# Patient Record
Sex: Female | Born: 1985 | Race: White | Hispanic: No | Marital: Single | Smoking: Never smoker
Health system: Southern US, Community
[De-identification: ages and names within clinical notes are randomized; demographics above are authoritative.]

## PROBLEM LIST (undated history)

## (undated) DIAGNOSIS — F329 Major depressive disorder, single episode, unspecified: Secondary | ICD-10-CM

## (undated) DIAGNOSIS — K5120 Ulcerative (chronic) proctitis without complications: Secondary | ICD-10-CM

## (undated) DIAGNOSIS — F419 Anxiety disorder, unspecified: Secondary | ICD-10-CM

## (undated) DIAGNOSIS — D649 Anemia, unspecified: Secondary | ICD-10-CM

## (undated) DIAGNOSIS — F32A Depression, unspecified: Secondary | ICD-10-CM

## (undated) HISTORY — DX: Ulcerative (chronic) proctitis without complications: K51.20

## (undated) HISTORY — DX: Depression, unspecified: F32.A

## (undated) HISTORY — PX: TONSILLECTOMY: SUR1361

## (undated) HISTORY — DX: Anxiety disorder, unspecified: F41.9

## (undated) HISTORY — DX: Major depressive disorder, single episode, unspecified: F32.9

## (undated) HISTORY — DX: Anemia, unspecified: D64.9

---
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
## 2005-09-11 ENCOUNTER — Other Ambulatory Visit: Admission: RE | Admit: 2005-09-11 | Discharge: 2005-09-11 | Payer: Self-pay | Admitting: Gynecology
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
## 2006-11-18 ENCOUNTER — Other Ambulatory Visit: Admission: RE | Admit: 2006-11-18 | Discharge: 2006-11-18 | Payer: Self-pay | Admitting: Gynecology

## 2007-06-17 ENCOUNTER — Encounter: Payer: Self-pay | Admitting: Internal Medicine

## 2007-06-19 DIAGNOSIS — R198 Other specified symptoms and signs involving the digestive system and abdomen: Secondary | ICD-10-CM | POA: Insufficient documentation

## 2007-06-19 DIAGNOSIS — R197 Diarrhea, unspecified: Secondary | ICD-10-CM | POA: Insufficient documentation

## 2007-06-19 DIAGNOSIS — K921 Melena: Secondary | ICD-10-CM | POA: Insufficient documentation

## 2007-06-19 DIAGNOSIS — R1032 Left lower quadrant pain: Secondary | ICD-10-CM | POA: Insufficient documentation

## 2007-12-17 ENCOUNTER — Other Ambulatory Visit: Admission: RE | Admit: 2007-12-17 | Discharge: 2007-12-17 | Payer: Self-pay | Admitting: Gynecology

## 2008-02-26 ENCOUNTER — Ambulatory Visit: Payer: Self-pay | Admitting: Internal Medicine

## 2008-02-27 ENCOUNTER — Telehealth: Payer: Self-pay | Admitting: Internal Medicine

## 2008-03-02 ENCOUNTER — Encounter: Payer: Self-pay | Admitting: Internal Medicine

## 2008-03-02 ENCOUNTER — Ambulatory Visit: Payer: Self-pay | Admitting: Internal Medicine

## 2008-03-03 ENCOUNTER — Encounter: Payer: Self-pay | Admitting: Internal Medicine

## 2008-03-03 ENCOUNTER — Encounter (INDEPENDENT_AMBULATORY_CARE_PROVIDER_SITE_OTHER): Payer: Self-pay | Admitting: *Deleted

## 2008-03-03 ENCOUNTER — Telehealth: Payer: Self-pay | Admitting: Internal Medicine

## 2008-04-05 ENCOUNTER — Ambulatory Visit: Payer: Self-pay | Admitting: Internal Medicine

## 2008-06-21 ENCOUNTER — Emergency Department (HOSPITAL_COMMUNITY): Admission: EM | Admit: 2008-06-21 | Discharge: 2008-06-22 | Payer: Self-pay | Admitting: Emergency Medicine

## 2008-09-03 ENCOUNTER — Emergency Department (HOSPITAL_COMMUNITY): Admission: EM | Admit: 2008-09-03 | Discharge: 2008-09-03 | Payer: Self-pay | Admitting: Emergency Medicine

## 2010-05-07 LAB — RAPID URINE DRUG SCREEN, HOSP PERFORMED
Amphetamines: NOT DETECTED
Barbiturates: NOT DETECTED
Benzodiazepines: NOT DETECTED
Cocaine: NOT DETECTED
Opiates: NOT DETECTED
Tetrahydrocannabinol: NOT DETECTED

## 2010-05-07 LAB — POCT PREGNANCY, URINE: Preg Test, Ur: NEGATIVE

## 2010-05-09 LAB — URINE MICROSCOPIC-ADD ON

## 2010-05-09 LAB — URINALYSIS, ROUTINE W REFLEX MICROSCOPIC
Bilirubin Urine: NEGATIVE
Glucose, UA: NEGATIVE mg/dL
Hgb urine dipstick: NEGATIVE
Leukocytes, UA: NEGATIVE
Nitrite: NEGATIVE
Protein, ur: 30 mg/dL — AB
Specific Gravity, Urine: 1.035 — ABNORMAL HIGH (ref 1.005–1.030)
Urobilinogen, UA: 1 mg/dL (ref 0.0–1.0)
pH: 7 (ref 5.0–8.0)

## 2010-05-09 LAB — WET PREP, GENITAL: Yeast Wet Prep HPF POC: NONE SEEN

## 2010-05-09 LAB — POCT PREGNANCY, URINE: Preg Test, Ur: NEGATIVE

## 2012-08-21 ENCOUNTER — Telehealth: Payer: Self-pay | Admitting: Internal Medicine

## 2012-08-21 NOTE — Telephone Encounter (Signed)
Spoke with patient and she states she had moved th [place] but has moved back to [place]. She has not had any problems since seeing Dr. Juanda Chance in 2010. She is at [facility] on vacation. She states she was bitten by a spider recently and went to Urgent Care. She was given a "shot of Prednisone" for the spider bite. She had some diarrhea then also and was told by UC that it was viral. She continued to have diarrhea and went to ED and was given Prednisone. Continues to have diarrhea going on 3 weeks. Scheduled her for OV with Willette Cluster, NP on 08/22/12 at 2:00 PM.

## 2012-08-21 NOTE — Telephone Encounter (Signed)
Left a message for patient to call me. 

## 2012-08-22 ENCOUNTER — Other Ambulatory Visit: Payer: Managed Care, Other (non HMO)

## 2012-08-22 ENCOUNTER — Ambulatory Visit (INDEPENDENT_AMBULATORY_CARE_PROVIDER_SITE_OTHER): Payer: Managed Care, Other (non HMO) | Admitting: Nurse Practitioner

## 2012-08-22 VITALS — BP 96/70 | HR 88 | Ht 63.5 in | Wt 121.5 lb

## 2012-08-22 DIAGNOSIS — R197 Diarrhea, unspecified: Secondary | ICD-10-CM

## 2012-08-22 DIAGNOSIS — K5190 Ulcerative colitis, unspecified, without complications: Secondary | ICD-10-CM

## 2012-08-22 NOTE — Progress Notes (Signed)
Reviewed, I would not wait for C.Diff to come back,  Would provide samples o fLialda or Delzicol  for 2 week period till we will obtain labs and/or till she can see me.

## 2012-08-22 NOTE — Patient Instructions (Addendum)
Your physician has requested that you go to the basement for the following lab work before leaving today: C DIff Gunnar Fusi will call you after the results are received. Follow up with Dr Juanda Chance on 09/23/2012 3:45 pm.  CC:  Loyal Jacobson MD

## 2012-08-22 NOTE — Progress Notes (Signed)
HPI :  Patient is a 65 year diagnosed with UC by Dr. Juanda Chance in 2010. 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 Patient was started on prednisone and Asacol. She moved to [place] approximatley 6 months later and we have not seen her since. Patient's symptoms improved but she discontinued Asacol as the pills were too large to swallow. Patient actually did fine for years from a GI standpoint until two weeks when she developed diarrhea after beng bitten by a spider.. Patient went to Urgent Care for the diarrhea, was given a steroid injection. Diarrhea improved for one day but patient ended up in ED in [facility] last Monday with loose black stools . She has not been taking iron nor bismuth products. I do not have the records but patient was told her lab work was okay. She was given 9 days of prednisone to take . Again, she felt better after the first day prednisone but then loose stools recurred. Stool color now normal. No rectal bleeding . She has lower abdominal pain with defecation and complains of LLQ "tightness" for a few months. He should not convinced this is ulcerative colitis. She is worried about C. difficile colitis as family members have a history of this. Patient did have antibiotics but that's been several months back. She has not started any new medications other than prednisone. No travel out of the country.  Past Medical History  Diagnosis Date  . Ulcerative colitis   . Anemia   . Anxiety   . Depression    Family History  Problem Relation Age of Onset  . Breast cancer Paternal Grandmother    History  Substance Use Topics  . Smoking status: Never Smoker   . Smokeless tobacco: Never Used  . Alcohol Use: Yes     Comment: 1 glass o fwine every 6 months   Current Outpatient Prescriptions  Medication Sig Dispense Refill  . citalopram (CELEXA) 40 MG tablet Take 40 mg by mouth daily.      Marland Kitchen MONONESSA 0.25-35 MG-MCG tablet Take 1 tablet by mouth daily.       .  predniSONE (DELTASONE) 20 MG tablet as directed. Tapering dose       No current facility-administered medications for this visit.   Allergies  Allergen Reactions  . Cefdinir Diarrhea    Abdominal pain   Review of Systems: All systems reviewed and negative except where noted in HPI.   Physical Exam: BP 96/70  Pulse 88  Ht 5' 3.5" (1.613 m)  Wt 121 lb 8 oz (55.112 kg)  BMI 21.18 kg/m2  LMP 07/28/2012 Constitutional: Pleasant,well-developed, white female in no acute distress. HEENT: Normocephalic and atraumatic. Conjunctivae are normal. No scleral icterus. Neck supple.  Cardiovascular: Normal rate, regular rhythm.  Pulmonary/chest: Effort normal and breath sounds normal. No wheezing, rales or rhonchi. Abdominal: Soft, nondistended, nontender. Bowel sounds active throughout. There are no masses palpable. No hepatomegaly. Rectal: No external hemorrhoids .On internal hemorrhoids on anoscopy but mucosa erythematous. Light brown, heme negative stool Extremities: no edema Lymphadenopathy: No cervical adenopathy noted. Neurological: Alert and oriented to person place and time. Skin: Skin is warm and dry. No rashes noted. Psychiatric: Normal mood and affect. Behavior is normal.   ASSESSMENT AND PLAN: 27 year old female diagnosed by Dr. Juanda Chance with universal UC in 2010. She has been off treatment for 5 years and doing okay until 2 weeks ago. Now with non-bloody diarrhea. Patient given a very short prednsone taper by ED in [place]  on Monday and so far hasn't improved. Will check stool for C-diff and call her with results. If C-diff negative then will restart Mesalamine (a brand other than Asacol as pills too large) and probably give her a longer prednisone taper. She will need follow up with Dr. Juanda Chance, her primary gastroenterologist..

## 2012-08-25 ENCOUNTER — Telehealth: Payer: Self-pay | Admitting: Nurse Practitioner

## 2012-08-25 LAB — CLOSTRIDIUM DIFFICILE BY PCR: Toxigenic C. Difficile by PCR: NOT DETECTED

## 2012-08-25 NOTE — Telephone Encounter (Signed)
Patient advised that her c-diff was negative would resume mesalamine and prednisone.  Willette Cluster RNP please advise

## 2012-08-27 ENCOUNTER — Other Ambulatory Visit: Payer: Self-pay | Admitting: Nurse Practitioner

## 2012-08-27 MED ORDER — MESALAMINE 1.2 G PO TBEC: 4800.0000 mg | DELAYED_RELEASE_TABLET | Freq: Every day | ORAL | Status: DC

## 2012-09-03 ENCOUNTER — Telehealth: Payer: Self-pay | Admitting: Nurse Practitioner

## 2012-09-03 NOTE — Telephone Encounter (Signed)
Left a message for patient to call me. 

## 2012-09-04 NOTE — Telephone Encounter (Signed)
Spoke with patient and she states she started having severe headaches just a few days after starting Lialda. She stopped taking it and the headaches went away. She does not get headaches routinely. She is asking for something else to try. Patient asks we leave her a voice mail with any changes. Please, advise.

## 2012-09-04 NOTE — Telephone Encounter (Signed)
Left a message for patient to call me. 

## 2012-09-05 NOTE — Telephone Encounter (Signed)
Spoke with Willette Cluster, NP and if patient's OV with Dr. Juanda Chance is in the near future, may stay off medication until then.  If she is not doing well, may need to switch medications. Moved OV to 09/16/12 at 10:30 am with Dr. Juanda Chance. Left message for patient with new appointment and request she call me to confirm it.

## 2012-09-08 NOTE — Telephone Encounter (Signed)
Left a message for patient to call me. 

## 2012-09-08 NOTE — Telephone Encounter (Signed)
Spoke with patient and she wants to keep her appointment on 09/23/12 at this time. Rescheduled to 10:00 AM on that day. She will call back if she can move the appointment.

## 2012-09-09 ENCOUNTER — Encounter: Payer: Self-pay | Admitting: Internal Medicine

## 2012-09-09 NOTE — Telephone Encounter (Signed)
Error

## 2012-09-16 ENCOUNTER — Ambulatory Visit: Payer: Managed Care, Other (non HMO) | Admitting: Internal Medicine

## 2012-09-22 ENCOUNTER — Telehealth: Payer: Self-pay | Admitting: Internal Medicine

## 2012-09-22 NOTE — Telephone Encounter (Signed)
Left a message for patient to call me. 

## 2012-09-23 ENCOUNTER — Ambulatory Visit: Payer: Managed Care, Other (non HMO) | Admitting: Internal Medicine

## 2012-09-23 NOTE — Telephone Encounter (Signed)
Left a message for patient to call me. 

## 2012-09-24 NOTE — Telephone Encounter (Signed)
Spoke with patient and she will have her schedule tomorrow for the next month. She will call back and ask for me and will schedule f/u OV.

## 2012-09-26 ENCOUNTER — Telehealth: Payer: Self-pay | Admitting: Nurse Practitioner

## 2012-10-01 NOTE — Telephone Encounter (Signed)
Erroneous encounter

## 2013-02-04 ENCOUNTER — Encounter: Payer: Self-pay | Admitting: Internal Medicine

## 2013-03-18 ENCOUNTER — Telehealth: Payer: Self-pay | Admitting: Internal Medicine

## 2013-03-18 NOTE — Telephone Encounter (Signed)
Patient wants to schedule OV for UC f/u and to discuss colonoscopy. Offered OV on 2/24 or 03/27/13 but she cannot do either of these. She wants to schedule on 04/16/13 at 8:45 AM. Scheduled patient on that day.

## 2013-03-20 ENCOUNTER — Encounter: Payer: Self-pay | Admitting: *Deleted

## 2013-04-03 ENCOUNTER — Other Ambulatory Visit (INDEPENDENT_AMBULATORY_CARE_PROVIDER_SITE_OTHER): Payer: Managed Care, Other (non HMO)

## 2013-04-03 ENCOUNTER — Ambulatory Visit (INDEPENDENT_AMBULATORY_CARE_PROVIDER_SITE_OTHER): Payer: Managed Care, Other (non HMO) | Admitting: Internal Medicine

## 2013-04-03 ENCOUNTER — Encounter: Payer: Self-pay | Admitting: Internal Medicine

## 2013-04-03 VITALS — BP 102/70 | HR 80 | Ht 63.5 in | Wt 135.5 lb

## 2013-04-03 DIAGNOSIS — K5190 Ulcerative colitis, unspecified, without complications: Secondary | ICD-10-CM

## 2013-04-03 DIAGNOSIS — K921 Melena: Secondary | ICD-10-CM

## 2013-04-03 LAB — CBC WITH DIFFERENTIAL/PLATELET
BASOS ABS: 0.1 10*3/uL (ref 0.0–0.1)
Basophils Relative: 0.6 % (ref 0.0–3.0)
EOS ABS: 0.2 10*3/uL (ref 0.0–0.7)
Eosinophils Relative: 2.4 % (ref 0.0–5.0)
HCT: 40 % (ref 36.0–46.0)
Hemoglobin: 13.3 g/dL (ref 12.0–15.0)
LYMPHS PCT: 41.1 % (ref 12.0–46.0)
Lymphs Abs: 3.6 10*3/uL (ref 0.7–4.0)
MCHC: 33.3 g/dL (ref 30.0–36.0)
MCV: 84.9 fl (ref 78.0–100.0)
Monocytes Absolute: 0.6 10*3/uL (ref 0.1–1.0)
Monocytes Relative: 7.3 % (ref 3.0–12.0)
NEUTROS PCT: 48.6 % (ref 43.0–77.0)
Neutro Abs: 4.2 10*3/uL (ref 1.4–7.7)
PLATELETS: 293 10*3/uL (ref 150.0–400.0)
RBC: 4.72 Mil/uL (ref 3.87–5.11)
RDW: 12.9 % (ref 11.5–14.6)
WBC: 8.7 10*3/uL (ref 4.5–10.5)

## 2013-04-03 LAB — SEDIMENTATION RATE: Sed Rate: 14 mm/hr (ref 0–22)

## 2013-04-03 LAB — IBC PANEL
Iron: 83 ug/dL (ref 42–145)
SATURATION RATIOS: 14.5 % — AB (ref 20.0–50.0)
TRANSFERRIN: 410 mg/dL — AB (ref 212.0–360.0)

## 2013-04-03 MED ORDER — MOVIPREP 100 G PO SOLR: 1.0000 | Freq: Once | ORAL | Status: DC

## 2013-04-03 MED ORDER — MESALAMINE 1.2 G PO TBEC: 2.4000 g | DELAYED_RELEASE_TABLET | Freq: Two times a day (BID) | ORAL | Status: DC

## 2013-04-03 MED ORDER — PREDNISONE 20 MG PO TABS: ORAL_TABLET | ORAL | Status: DC

## 2013-04-03 NOTE — Progress Notes (Signed)
Kayla Davidson 03/18/1985 [id number]  Note: This dictation was prepared with Dragon digital system. Any transcriptional errors that result from this procedure are unintentional.   History of Present Illness:  This is a 28 year old white female with a history of ulcerative pan colitis now with exacerbation. She describes mucus and blood per rectum. She has normal formed stools. She does have crampy abdominal pain. She denies urgency. Her energy has been very low. We saw her in 2010 for diarrhea and bleeding. A colonoscopy showed universal ulcerative colitis. Patient to [place] after I saw her in 2010  and moved back to [place] 2 years ago. She saw Midge MiniumPaula Guenther,NP in July 2014 for crampy abdominal pain. Her blood count was 13.8 and her hematocrit was 40.1. Her C Diff toxin was negative. She has not been taking any medications. There is no family history of colon cancer or colitis. 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    Past Medical History  Diagnosis Date  . Ulcerative colitis   . Anemia   . Anxiety   . Depression     Past Surgical History  Procedure Laterality Date  . Tonsillectomy      Allergies  Allergen Reactions  . Cefdinir Diarrhea    Abdominal pain     Family history and social history have been reviewed.  Review of Systems: Denies nausea vomiting heartburn  The remainder of the 10 point ROS is negative except as outlined in the H&P  Physical Exam: General Appearance Well developed, in no distress Eyes  Non icteric  HEENT  Non traumatic, normocephalic  Mouth No lesion, tongue papillated, no cheilosis Neck Supple without adenopathy, thyroid not enlarged, no carotid bruits, no JVD Lungs Clear to auscultation bilaterally COR Normal S1, normal S2, regular rhythm, no murmur, quiet precordium Abdomen minimally tender in left lower quadrant. Normoactive bowel sounds. No tympany. Liver edge at costal margin Rectal and anoscopic exam reveals  friable bleeding mucosa with mucous and blood consistent with moderately severe colitis. There are no hemorrhoids. Rectal sphincter tone is normal Extremities  No pedal edema Skin No lesions Neurological Alert and oriented x 3 Psychological Normal mood and affect  Assessment and Plan:   Problem #1 Active ulcerative colitis involving the rectum and possibility the left colon. Patient is not having any diarrhea. There is however, a history of universal ulcerative colitis on her last colonoscopy in 2010. We will schedule her for a colonoscopy and biopsies. In the meantime, she will start on mesalamine 4.8 g daily and on prednisone 40 mg daily for 3 days then down to 30 mg daily until her colonoscopy next week. We will also have her to get a blood count, sed rate and iron studies checked. I have given her samples of Canasa suppositories 1,000 mg to use at night. We have discussed maintenance therapy and diet. At this time, she does not have to be on a low-residue diet because her stools are formed and regular.    Lina SarDora Suleyman Ehrman 04/03/2013

## 2013-04-03 NOTE — Patient Instructions (Signed)
You have been scheduled for a colonoscopy with propofol. Please follow written instructions given to you at your visit today.  Please pick up your prep kit at the pharmacy within the next 1-3 days. If you use inhalers (even only as needed), please bring them with you on the day of your procedure. Your physician has requested that you go to www.startemmi.com and enter the access code given to you at your visit today. This web site gives a general overview about your procedure. However, you should still follow specific instructions given to you by our office regarding your preparation for the procedure.  Your physician has requested that you go to the basement for the following lab work before leaving today: CBC, IBC, Sed Rate  We have sent the following medications to your pharmacy for you to pick up at your convenience: Lialda Prednisone  Dr Olevia Perches has advised that you be on a prednisone taper. The taper instructions are as follows: 40 mg x 3 days 30 mg x 2 weeks 20 mg thereafter until told otherwise  CC:Dr Jefm Petty

## 2013-04-06 ENCOUNTER — Telehealth: Payer: Self-pay | Admitting: *Deleted

## 2013-04-06 NOTE — Telephone Encounter (Signed)
Patient states the day she started the Lialda she started having urgent watery diarrhea 5-8/day. Bleeding has stopped. Patient thinks the Theodosia BlenderLialda is causing the diarrhea since it started when she started taking it. Please, advise.

## 2013-04-06 NOTE — Telephone Encounter (Signed)
Please stop Lialda and continue Prednisone.

## 2013-04-06 NOTE — Telephone Encounter (Signed)
Left a message for patient to call me. 

## 2013-04-06 NOTE — Telephone Encounter (Signed)
Patient given Dr. Brodie's recommendations. 

## 2013-04-09 ENCOUNTER — Ambulatory Visit (AMBULATORY_SURGERY_CENTER): Payer: Managed Care, Other (non HMO) | Admitting: Internal Medicine

## 2013-04-09 ENCOUNTER — Encounter: Payer: Self-pay | Admitting: Internal Medicine

## 2013-04-09 ENCOUNTER — Encounter: Payer: Self-pay | Admitting: *Deleted

## 2013-04-09 VITALS — BP 113/74 | HR 64 | Temp 97.9°F | Resp 25 | Ht 63.5 in | Wt 135.0 lb

## 2013-04-09 DIAGNOSIS — K921 Melena: Secondary | ICD-10-CM

## 2013-04-09 DIAGNOSIS — K5190 Ulcerative colitis, unspecified, without complications: Secondary | ICD-10-CM

## 2013-04-09 DIAGNOSIS — K6289 Other specified diseases of anus and rectum: Secondary | ICD-10-CM

## 2013-04-09 MED ORDER — HYDROCORTISONE ACETATE 25 MG RE SUPP: 25.0000 mg | Freq: Two times a day (BID) | RECTAL | Status: DC

## 2013-04-09 MED ORDER — SODIUM CHLORIDE 0.9 % IV SOLN: 500.0000 mL | INTRAVENOUS | Status: DC

## 2013-04-09 NOTE — Progress Notes (Signed)
Called to room to assist during endoscopic procedure.  Patient ID and intended procedure confirmed with present staff. Received instructions for my participation in the procedure from the performing physician.  

## 2013-04-09 NOTE — Progress Notes (Signed)
A/ox3 pleased with MAC, report to celia RN 

## 2013-04-09 NOTE — Patient Instructions (Signed)
Discharge instructions given with verbal understanding. Samples given per Dr. Juanda ChanceBrodie Prescription given per Dr. Juanda ChanceBrodie. Resume previous medications. YOU HAD AN ENDOSCOPIC PROCEDURE TODAY AT THE [facility]: Refer to the procedure report that was given to you for any specific questions about what was found during the examination.  If the procedure report does not answer your questions, please call your gastroenterologist to clarify.  If you requested that your care partner not be given the details of your procedure findings, then the procedure report has been included in a sealed envelope for you to review at your convenience later.  YOU SHOULD EXPECT: Some feelings of bloating in the abdomen. Passage of more gas than usual.  Walking can help get rid of the air that was put into your GI tract during the procedure and reduce the bloating. If you had a lower endoscopy (such as a colonoscopy or flexible sigmoidoscopy) you may notice spotting of blood in your stool or on the toilet paper. If you underwent a bowel prep for your procedure, then you may not have a normal bowel movement for a few days.  DIET: Your first meal following the procedure should be a light meal and then it is ok to progress to your normal diet.  A half-sandwich or bowl of soup is an example of a good first meal.  Heavy or fried foods are harder to digest and may make you feel nauseous or bloated.  Likewise meals heavy in dairy and vegetables can cause extra gas to form and this can also increase the bloating.  Drink plenty of fluids but you should avoid alcoholic beverages for 24 hours.  ACTIVITY: Your care partner should take you home directly after the procedure.  You should plan to take it easy, moving slowly for the rest of the day.  You can resume normal activity the day after the procedure however you should NOT DRIVE or use heavy machinery for 24 hours (because of the sedation medicines used during the test).     SYMPTOMS TO REPORT IMMEDIATELY: A gastroenterologist can be reached at any hour.  During normal business hours, 8:30 AM to 5:00 PM Monday through Friday, call [phone].  After hours and on weekends, please call the GI answering service at [phone] who will take a message and have the physician on call contact you.   Following lower endoscopy (colonoscopy or flexible sigmoidoscopy):  Excessive amounts of blood in the stool  Significant tenderness or worsening of abdominal pains  Swelling of the abdomen that is new, acute  Fever of 100F or higher  FOLLOW UP: If any biopsies were taken you will be contacted by phone or by letter within the next 1-3 weeks.  Call your gastroenterologist if you have not heard about the biopsies in 3 weeks.  Our staff will call the home number listed on your records the next business day following your procedure to check on you and address any questions or concerns that you may have at that time regarding the information given to you following your procedure. This is a courtesy call and so if there is no answer at the home number and we have not heard from you through the emergency physician on call, we will assume that you have returned to your regular daily activities without incident.  SIGNATURES/CONFIDENTIALITY: You and/or your care partner have signed paperwork which will be entered into your electronic medical record.  These signatures attest to the fact that that the information above  on your After Visit Summary has been reviewed and is understood.  Full responsibility of the confidentiality of this discharge information lies with you and/or your care-partner.

## 2013-04-09 NOTE — Op Note (Addendum)
[facility] [address] [place], [address]   COLONOSCOPY PROCEDURE REPORT  PATIENT: Kayla, Davidson  MR#: [id number] BIRTHDATE: 15-May-1985 , 28  yrs. old GENDER: Female ENDOSCOPIST: Hart Carwin, MD REFERRED [id number] Leonette Most, M.D. PROCEDURE DATE:  04/09/2013 PROCEDURE:   Colonoscopy with biopsy First Screening Colonoscopy - Avg.  risk and is 50 yrs.  old or older - No.  Prior Negative Screening - Now for repeat screening. N/A  History of Adenoma - Now for follow-up colonoscopy & has been > or = to 3 yrs.  N/A  Polyps Removed Today? No.  Recommend repeat exam, <10 yrs? Yes.  High risk (family or personal hx). ASA CLASS:   Class II INDICATIONS:ER.  History of ulcerative colitis.  Last colonoscopy in 2000 and showed pancolitis.Marland Kitchen MEDICATIONS: MAC sedation, administered by CRNA and propofol (Diprivan) 350mg  IV  DESCRIPTION OF PROCEDURE:   After the risks benefits and alternatives of the procedure were thoroughly explained, informed consent was obtained.  A digital rectal exam revealed no abnormalities of the rectum.   The LB [id number]  endoscope was introduced through the anus and advanced to the cecum, which was identified by both the appendix and ileocecal valve. No adverse events experienced.   The quality of the prep was good, using MoviPrep  The instrument was then slowly withdrawn as the colon was fully examined.      COLON FINDINGS: Rectal ampulla and rectal sigmoid colon appeared inflamed.  There was granularity bleeding and friable mucosa from 0-5 cm these findings were consistent with the ulcerative proctitis.  Starting at the 10 cm from the rectum mucosa appeared the erythematous but there was no friability or granularity. Biopsies were taken from sigmoid colon from 10-30 cm.  Descending colon was normal, splenic flexure transverse colon hepatic flexure and ascending colon appeared normal cecum 5 showed erythema and around the  appendiceal opening.  Biopsies were obtained from cecal pouch.  The ileocecal valve was normal and the scope was then retracted..  Retroflexion of the endoscope in the rectum confirmed presence of proctitis.  Retroflexed views revealed no abnormalities. The time to cecum=7 minutes 32 seconds.  Withdrawal time=9 minutes 58 seconds.  The scope was withdrawn and the procedure completed. COMPLICATIONS: There were no complications.  ENDOSCOPIC IMPRESSION: Rectal ampulla and rectal sigmoid colon appeared inflamed.  There was granularity bleeding and friable mucosa from 0-5 cm these findings were consistent with the ulcerative proctitis.  Starting at the 10 cm from the rectum mucosa appeared the erythematous but there was no friability or granularity.  Biopsies were taken from sigmoid colon from 10-30 cm.  Descending colon was normal, splenic flexure transverse colon hepatic flexure and ascending colon appeared normal cecum 5 showed erythema and around the appendiceal opening.  Biopsies were obtained from cecal pouch.  The ileocecal valve was normal and the scope was then retracted..  Retroflexion of the endoscope in the rectum confirmed presence of proctitis Conclusion: active ulcerative proctitis area Mild colitis in the cecum Normal appearing descending transverse and descending colon biopsies pending   RECOMMENDATIONS: 1.  Await pathology results 2.  Canasa suppositories 1000mg , samples, 1 hs, if insurance does not cover, start Hydrocorticone supp 25 mg, bid, #12, 3 refills Continue prednisone taper Lialda stopped due to side effects FMLA form to be filled out OV 4 weeks Recall colonoscopy pending biopsies   eSigned:  Hart Carwin, MD 04/09/2013 11:24 AM Revised: 04/09/2013 11:24 AM  cc:   PATIENT NAME:  Kayla Davidson, Kayla A. MR#: [id number]

## 2013-04-10 ENCOUNTER — Telehealth: Payer: Self-pay | Admitting: *Deleted

## 2013-04-10 NOTE — Telephone Encounter (Signed)
No answer. Name identifier. Message left to call if any questions or concerns. 

## 2013-04-14 ENCOUNTER — Encounter: Payer: Self-pay | Admitting: Internal Medicine

## 2013-04-16 ENCOUNTER — Ambulatory Visit: Payer: Managed Care, Other (non HMO) | Admitting: Internal Medicine

## 2013-04-17 ENCOUNTER — Encounter: Payer: Self-pay | Admitting: *Deleted

## 2013-04-17 ENCOUNTER — Telehealth: Payer: Self-pay | Admitting: Internal Medicine

## 2013-04-17 MED ORDER — LORAZEPAM 0.5 MG PO TABS: ORAL_TABLET | ORAL | Status: DC

## 2013-04-17 NOTE — Telephone Encounter (Signed)
Spoke with patient and gave her Bx results as per letter on 04/14/13. [employer] is looking for her FMLA forms that have been sent down. She reports she is tapering Prednisone and is now on 20 mg/daily. She is having difficulty sleeping. Please, advise.

## 2013-04-17 NOTE — Telephone Encounter (Signed)
If she is doing well, she can go down to 15mg /day which will help to reduce the anxiety. You can also send her Ativen .5 mg, # 30 1 po qhs prn rest, 1 refill

## 2013-04-17 NOTE — Telephone Encounter (Signed)
rx sent for Ativan. Left a message for patient to call me.

## 2013-04-20 NOTE — Telephone Encounter (Signed)
Left a message for patient to call me. 

## 2013-04-20 NOTE — Telephone Encounter (Signed)
Spoke with patient and gave her recommendations. FMLA forms not completed yet [facility]. Marcelino [facility]le [facility] to call patient.

## 2013-05-05 ENCOUNTER — Telehealth: Payer: Self-pay | Admitting: Internal Medicine

## 2013-05-05 NOTE — Telephone Encounter (Signed)
Please start hydrocortisone supp 25mg , 1 hs #30, 1 refill.

## 2013-05-05 NOTE — Telephone Encounter (Signed)
Left a message for patient to call me. 

## 2013-05-05 NOTE — Telephone Encounter (Signed)
Patient calling because she thinks she may have hemorrhoids. States she started having rectal itching about a week ago.Now, has pain and blood in stool and when she wipes. Symptoms started prior to her completing her Prednisone taper. She did complete her taper 1 week ago. Please, advise.

## 2013-05-06 MED ORDER — HYDROCORTISONE ACETATE 25 MG RE SUPP: RECTAL | Status: DC

## 2013-05-06 NOTE — Telephone Encounter (Signed)
Patient notified of recommendations. Rx sent. 

## 2013-05-11 ENCOUNTER — Ambulatory Visit (INDEPENDENT_AMBULATORY_CARE_PROVIDER_SITE_OTHER): Payer: Managed Care, Other (non HMO) | Admitting: Gastroenterology

## 2013-05-11 ENCOUNTER — Encounter: Payer: Self-pay | Admitting: Gastroenterology

## 2013-05-11 ENCOUNTER — Telehealth: Payer: Self-pay | Admitting: Internal Medicine

## 2013-05-11 VITALS — BP 112/70 | HR 74 | Ht 64.0 in | Wt 129.0 lb

## 2013-05-11 DIAGNOSIS — R21 Rash and other nonspecific skin eruption: Secondary | ICD-10-CM | POA: Insufficient documentation

## 2013-05-11 MED ORDER — CLOTRIMAZOLE 1 % EX CREA: 1.0000 "application " | TOPICAL_CREAM | Freq: Two times a day (BID) | CUTANEOUS | Status: DC

## 2013-05-11 NOTE — Progress Notes (Signed)
Reviewed and agree. Pt seen with Dr Arlyce DiceKaplan

## 2013-05-11 NOTE — Telephone Encounter (Signed)
Patient states the suppositories and hemorrhoid care is not helping with pain. Scheduled patient with Doug SouJessica Zehr, PA today at 11:00 AM.

## 2013-05-11 NOTE — Progress Notes (Signed)
05/11/2013 Kayla Davidson [id number] 11/26/1985   History of Present Illness:  Patient is a 28 year old female who is known to Dr. Juanda ChanceBrodie for treatment of her ulcerative colitis.  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  After that she moved to [place] for a couple of years, but has since moved back to [place] and has continued to follow with Dr. Juanda ChanceBrodie here.  She was not on any medication until recently.  Took Lialda for a couple of days, but it was stopped due to side effects.  Most recently she was on a steroid taper and was given hydrocortisone suppositories (plan was supposed to be to switch to Canasa suppositories eventually if her insurance would cover them).  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  She has been using the hydrocortisone suppositories since that time, but the prednisone taper has been finished.    She comes in today complaining of pain and irritation around her anus from what she thought was a hemorrhoid.  This began about a week or so after the colonoscopy.  She has tried OTC preparation H cooling gel as well as Oncologistwitch Hazel with no relief.  She says that it hurts to sit and it is painful to insert the suppositories.  She denies diarrhea or fever.  She has been very aggressive with hygiene to the area.     Current Medications, Allergies, Past Medical History, Past Surgical History, Family History and Social History were reviewed in [facility] electronic medical record.   Physical Exam: BP 112/70  Pulse 74  Ht 5\' 4"  (1.626 m)  Wt 129 lb (58.514 kg)  BMI 22.13 kg/m2 General: Well developed white female in no acute distress Head: Normocephalic and atraumatic Eyes:  Sclerae anicteric, conjunctiva pink  Ears: Normal auditory acuity Lungs: Clear throughout to auscultation Heart: Regular rate  and rhythm. Rectal:  Erythematous, painful rash surrounding the anus and extending into the intergluteal cleft.  No hemorrhoids noted.   Musculoskeletal: Symmetrical with no gross deformities  Extremities: No edema  Neurological: Alert oriented x 4, grossly non-focal Psychological:  Alert and cooperative. Normal mood and affect  Assessment and Recommendations: -28 year old female with ulcerative proctitis as seen on recent colonoscopy now presenting with approximately 3-4 week history of painful irritation around her anus from what she thought was a hemorrhoid.  No relief with suppositories (and they are painful to insert) or OTC treatments.  Looks like a fungal rash in the intergluteal cleft/perianal area.  I spoke with Dr. Arlyce DiceKaplan who also examined her.  Will try lotrimin cream (clotrimazole) 1% to be applied twice daily.  She will return to her already scheduled appt with Dr. Juanda ChanceBrodie on Wednesday for recheck of this area as well as long-term plans for treatment on her UC.

## 2013-05-11 NOTE — Patient Instructions (Signed)
Please keep your appointment with Dr. Juanda ChanceBrodie on Wednesday.  We have sent the following medications to your pharmacy for you to pick up at your convenience: Anti Fungal Cream, please use as directed

## 2013-05-13 ENCOUNTER — Encounter: Payer: Self-pay | Admitting: Internal Medicine

## 2013-05-13 ENCOUNTER — Ambulatory Visit (INDEPENDENT_AMBULATORY_CARE_PROVIDER_SITE_OTHER): Payer: Managed Care, Other (non HMO) | Admitting: Internal Medicine

## 2013-05-13 VITALS — BP 102/66 | HR 88 | Ht 63.5 in | Wt 132.0 lb

## 2013-05-13 DIAGNOSIS — K5190 Ulcerative colitis, unspecified, without complications: Secondary | ICD-10-CM

## 2013-05-13 DIAGNOSIS — R21 Rash and other nonspecific skin eruption: Secondary | ICD-10-CM

## 2013-05-13 MED ORDER — FLUCONAZOLE 100 MG PO TABS: 100.0000 mg | ORAL_TABLET | Freq: Every day | ORAL | Status: DC

## 2013-05-13 NOTE — Progress Notes (Signed)
Kayla Davidson 02/14/1985 [id number]  Note: This dictation was prepared with Dragon digital system. Any transcriptional errors that result from this procedure are unintentional.   History of Present Illness: This is a 28 year old white female with ulcerative proctitis diagnosed in 2000 and. She had exacerbation in January 2015 and this has responded to steroids tapered. Several weeks after discontinuing the prednisone she has developed what rash around the anal area which is pruritic and intensely red. She was evaluated by PA last week and put on crow clotrimazole cream. She has been on the Canasa suppositories but could not continue because of the high cost. We have called her some hydrocortisone suppositories instead. She saw Dr.Mezer, her gynecologist this morning but he did not find any of yeast in her urine.     Past Medical History  Diagnosis Date  . Ulcerative colitis   . Anemia   . Anxiety   . Depression     Past Surgical History  Procedure Laterality Date  . Tonsillectomy      Allergies  Allergen Reactions  . Cefdinir Diarrhea    Abdominal pain     Family history and social history have been reviewed.  Review of Systems: Denies abdominal pain diarrhea nausea vomiting  The remainder of the 10 point ROS is negative except as outlined in the H&P  Physical Exam: General Appearance Well developed, in no distress Eyes  Non icteric  HEENT  Non traumatic, normocephalic  Mouth No lesion, tongue papillated, no cheilosis Neck Supple without adenopathy, thyroid not enlarged, no carotid bruits, no JVD Lungs Clear to auscultation bilaterally COR Normal S1, normal S2, regular rhythm, no murmur, quiet precordium Abdomen Soft nontender with normoactive bowel sounds  Rectal An anoscopic exam reveals intense perianal erythema extending into the cranial crease with some weeping from the crease and whitish or exudate. Anal canal itself appeared normal there were no internal  hemorrhoids and no evidence of proctitis. Stool is Hemoccult negative , the rash did not seem to be vesicular and did not appear to be painful  Extremities  No pedal edema Skin No lesions Neurological Alert and oriented x 3 Psychological Normal mood and affect  Assessment and Plan:   Perianal dermatitis that is suggestive of Candida dermatitis, the proctitis has cleared up. It could be also a contact dermatitis from Canasa suppositories. She will discontinue Canasa suppository and start Diflucan 100 mg daily for 5 days. I have also given her sample although topical 2% lidocaine cream that she can use for a symptomatic relief of the perianal  discomfort. She will call us was back in 3 days to give us an update    Kayla Davidson 05/13/2013

## 2013-05-13 NOTE — Patient Instructions (Addendum)
We have sent the following medications to your pharmacy for you to pick up at your convenience: Diflucan  Please discontinue hydrocortisone suppositories.

## 2013-05-14 ENCOUNTER — Encounter: Payer: Self-pay | Admitting: Internal Medicine

## 2013-05-19 ENCOUNTER — Telehealth: Payer: Self-pay | Admitting: Internal Medicine

## 2013-05-19 NOTE — Telephone Encounter (Signed)
Spoke with patient and she thought she was better until she went to bathroom this AM.States after going, she felt a hot, burning sensation just inside rectum and on the outside. States the burning has continued. She reports she took a Benadryl to see if that would help but the burning continues. Please, advise.

## 2013-05-20 NOTE — Telephone Encounter (Signed)
Left a message for patient to call me. 

## 2013-05-20 NOTE — Telephone Encounter (Signed)
She needs some of 2% Lidocaine ointment may be available OTC, .

## 2013-05-20 NOTE — Telephone Encounter (Signed)
Patient given recommendation. 

## 2013-05-26 ENCOUNTER — Telehealth: Payer: Self-pay | Admitting: Internal Medicine

## 2013-05-26 NOTE — Telephone Encounter (Signed)
Pt states she started using the anusol supp and now the rash she had has returned. Pt wants to know if we can call in something for yeast again. Pt had Diflucan 100mg  #5, 1 daily last time. Please advise.

## 2013-05-26 NOTE — Telephone Encounter (Signed)
Please refill Diflucan 100mg , #5, 1 po qd, 1 refill

## 2013-05-27 MED ORDER — FLUCONAZOLE 100 MG PO TABS: 100.0000 mg | ORAL_TABLET | Freq: Every day | ORAL | Status: DC

## 2013-05-27 NOTE — Telephone Encounter (Signed)
Rx for diflucan sent. Patient advised.

## 2013-06-17 ENCOUNTER — Telehealth: Payer: Self-pay | Admitting: *Deleted

## 2013-06-17 MED ORDER — BALSALAZIDE DISODIUM 750 MG PO CAPS: ORAL_CAPSULE | ORAL | Status: DC

## 2013-06-17 NOTE — Telephone Encounter (Signed)
Patient states her rectal pain and bleeding has started back again. She also thinks she has a bulge in groin ?hernia. Scheduled with Dr. Juanda ChanceBrodie on 06/19/13 at 2:30 PM.

## 2013-06-17 NOTE — Telephone Encounter (Signed)
Rx sent to pharmacy. Left a message for patient to call me. 

## 2013-06-17 NOTE — Telephone Encounter (Signed)
Please start Colazal 750 mg, 2 po bid, please give samples if she cannot afford it. #120, 2 refills,

## 2013-06-17 NOTE — Telephone Encounter (Signed)
Patient notified of recommendations. 

## 2013-06-19 ENCOUNTER — Ambulatory Visit (INDEPENDENT_AMBULATORY_CARE_PROVIDER_SITE_OTHER): Payer: Managed Care, Other (non HMO) | Admitting: Internal Medicine

## 2013-06-19 ENCOUNTER — Encounter: Payer: Self-pay | Admitting: Internal Medicine

## 2013-06-19 VITALS — BP 106/74 | HR 88 | Ht 63.5 in | Wt 135.2 lb

## 2013-06-19 DIAGNOSIS — B372 Candidiasis of skin and nail: Secondary | ICD-10-CM

## 2013-06-19 DIAGNOSIS — R21 Rash and other nonspecific skin eruption: Secondary | ICD-10-CM

## 2013-06-19 DIAGNOSIS — K921 Melena: Secondary | ICD-10-CM

## 2013-06-19 DIAGNOSIS — K5190 Ulcerative colitis, unspecified, without complications: Secondary | ICD-10-CM

## 2013-06-19 MED ORDER — RANITIDINE HCL 300 MG PO TABS: 300.0000 mg | ORAL_TABLET | Freq: Every day | ORAL | Status: AC

## 2013-06-19 MED ORDER — FLUCONAZOLE 100 MG PO TABS: 100.0000 mg | ORAL_TABLET | Freq: Every day | ORAL | Status: AC

## 2013-06-19 MED ORDER — AMBULATORY NON FORMULARY MEDICATION: Status: AC

## 2013-06-19 MED ORDER — CIPROFLOXACIN HCL 250 MG PO TABS: 250.0000 mg | ORAL_TABLET | Freq: Two times a day (BID) | ORAL | Status: AC

## 2013-06-19 MED ORDER — BALSALAZIDE DISODIUM 750 MG PO CAPS: ORAL_CAPSULE | ORAL | Status: AC

## 2013-06-19 MED ORDER — ONDANSETRON HCL 4 MG PO TABS: 4.0000 mg | ORAL_TABLET | ORAL | Status: AC

## 2013-06-19 NOTE — Progress Notes (Signed)
Kayla Davidson [birth year] [id number]  Note: This dictation was prepared with Dragon digital system. Any transcriptional errors that result from this procedure are unintentional.   History of Present Illness:  This is a 28 year old white female with ulcerative proctitis diagnosed in 2010 and confirmed on a recent colonoscopy in March 2015 which showed chronic active inflammation from 0-5 cm. Biopsies from the descending, ascending and sigmoid colon were all normal. She was unable to tolerate Lialda and Canasa suppositories. She is still having rectal bleeding and severe rectal pain as well as difficulty evacuating stool which is otherwise soft. She also has been nauseated, especially in the mornings without having any vomiting. Her appetite has been good and she has been able to eat 3 meals a day. She saw Dr Chevis Pretty yesterday and was found to have reactive lymph nodes in the right groin. She had intense perianal erythema which responded to Diflucan but it has recurred and is still partially present.    Past Medical History  Diagnosis Date  . Ulcerative proctitis   . Anemia   . Anxiety   . Depression     Past Surgical History  Procedure Laterality Date  . Tonsillectomy      Allergies  Allergen Reactions  . Cefdinir Diarrhea    Abdominal pain     Family history and social history have been reviewed.  Review of Systems:   The remainder of the 10 point ROS is negative except as outlined in the H&P  Physical Exam: General Appearance Well developed, in no distress Eyes  Non icteric  HEENT  Non traumatic, normocephalic  Mouth No lesion, tongue papillated, no cheilosis Neck Supple without adenopathy, thyroid not enlarged, no carotid bruits, no JVD Lungs Clear to auscultation bilaterally COR Normal S1, normal S2, regular rhythm, no murmur, quiet precordium Abdomen soft nontender with normoactive bowel sounds. No distention, tender enlarged right groin lymph nodes most likely  reactive Rectal and anoscopic exam reveals intense perianal rash in the crease between the buttocks which is suggestive of yeast infection. It is very tender and has  whitish exudate. The rectum is very tight and very painful on digital exam. Mucosa is erythematous, Edema  extends into the rectal ampulla. There is no bleeding or friability in the rectal ampulla. the rectal sphincter tone is definitely increased. Extremities  No pedal edema Skin No lesions Neurological Alert and oriented x 3 Psychological Normal mood and affect  Assessment and Plan:   Problem #1 Ulcerative proctitis. Patient has continued bleeding. From proctitis and anorectal spasm. 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 We will try her on Diltiazem gel 2 %, twice a day. She will also start Colazal 750 mg, one by mouth twice a day for colitis.  Problem #2 Candida dermatitis. She will start Diflucan 100 mg daily for 10 days.  Problem #3 Right inguinal adenopathy, likely reactive secondary to infection and inflammation in the rectal area, we will give her a course of Cipro 250 mg twice a day for 10 days together with the Diflucan.  Problem #4 Nausea.is rather non specific since no vomiting occurs and pt able to eat. Rule out hyperacidity, rule out stress related. We will give Zofran 4 mg to take in the morning and ranitidine 300 mg at bedtime. I will see her in 4 weeks.  Hart Carwin 06/19/2013

## 2013-06-19 NOTE — Patient Instructions (Signed)
We have sent the following medications to your pharmacy for you to pick up at your convenience: Colozal Ranitidine zofran Diflucan Cipro  We have sent a prescription of diltiazem [facility].  Please follow up with Dr Juanda Chance in 4 weeks.  CC:Dr Luan Moore, Dr Chevis Pretty

## 2013-09-30 ENCOUNTER — Encounter: Payer: Self-pay | Admitting: Internal Medicine

## 2014-01-13 ENCOUNTER — Other Ambulatory Visit: Payer: Self-pay | Admitting: Gynecology

## 2014-01-14 LAB — CYTOLOGY - PAP

## 2015-07-20 DIAGNOSIS — L2089 Other atopic dermatitis: Secondary | ICD-10-CM | POA: Diagnosis not present

## 2015-10-07 DIAGNOSIS — Z23 Encounter for immunization: Secondary | ICD-10-CM | POA: Diagnosis not present

## 2015-10-07 DIAGNOSIS — R635 Abnormal weight gain: Secondary | ICD-10-CM | POA: Diagnosis not present

## 2015-10-07 DIAGNOSIS — K5190 Ulcerative colitis, unspecified, without complications: Secondary | ICD-10-CM | POA: Diagnosis not present

## 2015-10-07 DIAGNOSIS — E785 Hyperlipidemia, unspecified: Secondary | ICD-10-CM | POA: Diagnosis not present

## 2015-10-11 DIAGNOSIS — Z1329 Encounter for screening for other suspected endocrine disorder: Secondary | ICD-10-CM | POA: Diagnosis not present

## 2015-10-11 DIAGNOSIS — Z6826 Body mass index (BMI) 26.0-26.9, adult: Secondary | ICD-10-CM | POA: Diagnosis not present

## 2015-10-11 DIAGNOSIS — Z131 Encounter for screening for diabetes mellitus: Secondary | ICD-10-CM | POA: Diagnosis not present

## 2015-10-11 DIAGNOSIS — Z713 Dietary counseling and surveillance: Secondary | ICD-10-CM | POA: Diagnosis not present

## 2015-11-07 DIAGNOSIS — J3501 Chronic tonsillitis: Secondary | ICD-10-CM | POA: Diagnosis not present

## 2015-11-07 DIAGNOSIS — Z114 Encounter for screening for human immunodeficiency virus [HIV]: Secondary | ICD-10-CM | POA: Diagnosis not present

## 2015-11-07 DIAGNOSIS — Z113 Encounter for screening for infections with a predominantly sexual mode of transmission: Secondary | ICD-10-CM | POA: Diagnosis not present

## 2015-11-07 DIAGNOSIS — Z1159 Encounter for screening for other viral diseases: Secondary | ICD-10-CM | POA: Diagnosis not present

## 2015-11-07 DIAGNOSIS — N760 Acute vaginitis: Secondary | ICD-10-CM | POA: Diagnosis not present

## 2015-11-07 DIAGNOSIS — Z6825 Body mass index (BMI) 25.0-25.9, adult: Secondary | ICD-10-CM | POA: Diagnosis not present

## 2015-11-07 DIAGNOSIS — E6609 Other obesity due to excess calories: Secondary | ICD-10-CM | POA: Diagnosis not present

## 2015-12-01 DIAGNOSIS — Z113 Encounter for screening for infections with a predominantly sexual mode of transmission: Secondary | ICD-10-CM | POA: Diagnosis not present

## 2015-12-01 DIAGNOSIS — N760 Acute vaginitis: Secondary | ICD-10-CM | POA: Diagnosis not present

## 2015-12-04 DIAGNOSIS — J069 Acute upper respiratory infection, unspecified: Secondary | ICD-10-CM | POA: Diagnosis not present

## 2015-12-07 DIAGNOSIS — J029 Acute pharyngitis, unspecified: Secondary | ICD-10-CM | POA: Diagnosis not present

## 2015-12-16 DIAGNOSIS — F418 Other specified anxiety disorders: Secondary | ICD-10-CM | POA: Diagnosis not present

## 2015-12-20 DIAGNOSIS — Z13220 Encounter for screening for lipoid disorders: Secondary | ICD-10-CM | POA: Diagnosis not present

## 2015-12-20 DIAGNOSIS — E7800 Pure hypercholesterolemia, unspecified: Secondary | ICD-10-CM | POA: Diagnosis not present

## 2015-12-20 DIAGNOSIS — N760 Acute vaginitis: Secondary | ICD-10-CM | POA: Diagnosis not present

## 2016-01-18 DIAGNOSIS — Z01419 Encounter for gynecological examination (general) (routine) without abnormal findings: Secondary | ICD-10-CM | POA: Diagnosis not present

## 2016-01-18 DIAGNOSIS — Z113 Encounter for screening for infections with a predominantly sexual mode of transmission: Secondary | ICD-10-CM | POA: Diagnosis not present

## 2016-01-18 DIAGNOSIS — Z6823 Body mass index (BMI) 23.0-23.9, adult: Secondary | ICD-10-CM | POA: Diagnosis not present

## 2016-02-02 DIAGNOSIS — R52 Pain, unspecified: Secondary | ICD-10-CM | POA: Diagnosis not present

## 2016-02-02 DIAGNOSIS — D225 Melanocytic nevi of trunk: Secondary | ICD-10-CM | POA: Diagnosis not present

## 2016-03-21 DIAGNOSIS — Z113 Encounter for screening for infections with a predominantly sexual mode of transmission: Secondary | ICD-10-CM | POA: Diagnosis not present

## 2016-03-26 DIAGNOSIS — J111 Influenza due to unidentified influenza virus with other respiratory manifestations: Secondary | ICD-10-CM | POA: Diagnosis not present

## 2016-07-19 DIAGNOSIS — Z6824 Body mass index (BMI) 24.0-24.9, adult: Secondary | ICD-10-CM | POA: Diagnosis not present

## 2016-07-19 DIAGNOSIS — Z113 Encounter for screening for infections with a predominantly sexual mode of transmission: Secondary | ICD-10-CM | POA: Diagnosis not present

## 2016-07-19 DIAGNOSIS — E785 Hyperlipidemia, unspecified: Secondary | ICD-10-CM | POA: Diagnosis not present

## 2016-07-19 DIAGNOSIS — F329 Major depressive disorder, single episode, unspecified: Secondary | ICD-10-CM | POA: Diagnosis not present

## 2016-07-19 DIAGNOSIS — F419 Anxiety disorder, unspecified: Secondary | ICD-10-CM | POA: Diagnosis not present

## 2016-08-27 DIAGNOSIS — L710 Perioral dermatitis: Secondary | ICD-10-CM | POA: Diagnosis not present

## 2016-09-03 DIAGNOSIS — N760 Acute vaginitis: Secondary | ICD-10-CM | POA: Diagnosis not present

## 2016-10-03 DIAGNOSIS — Z113 Encounter for screening for infections with a predominantly sexual mode of transmission: Secondary | ICD-10-CM | POA: Diagnosis not present

## 2016-10-03 DIAGNOSIS — N760 Acute vaginitis: Secondary | ICD-10-CM | POA: Diagnosis not present

## 2016-11-02 DIAGNOSIS — R590 Localized enlarged lymph nodes: Secondary | ICD-10-CM | POA: Diagnosis not present

## 2016-11-02 DIAGNOSIS — Z23 Encounter for immunization: Secondary | ICD-10-CM | POA: Diagnosis not present

## 2016-11-30 DIAGNOSIS — N760 Acute vaginitis: Secondary | ICD-10-CM | POA: Diagnosis not present

## 2016-11-30 DIAGNOSIS — Z113 Encounter for screening for infections with a predominantly sexual mode of transmission: Secondary | ICD-10-CM | POA: Diagnosis not present

## 2016-12-18 DIAGNOSIS — R0789 Other chest pain: Secondary | ICD-10-CM | POA: Diagnosis not present

## 2017-02-14 DIAGNOSIS — Z113 Encounter for screening for infections with a predominantly sexual mode of transmission: Secondary | ICD-10-CM | POA: Diagnosis not present

## 2017-02-14 DIAGNOSIS — Z6824 Body mass index (BMI) 24.0-24.9, adult: Secondary | ICD-10-CM | POA: Diagnosis not present

## 2017-02-14 DIAGNOSIS — Z01419 Encounter for gynecological examination (general) (routine) without abnormal findings: Secondary | ICD-10-CM | POA: Diagnosis not present

## 2017-04-12 DIAGNOSIS — J302 Other seasonal allergic rhinitis: Secondary | ICD-10-CM | POA: Diagnosis not present

## 2019-11-10 DIAGNOSIS — Z3040 Encounter for surveillance of contraceptives, unspecified: Secondary | ICD-10-CM | POA: Diagnosis not present

## 2019-11-10 DIAGNOSIS — N939 Abnormal uterine and vaginal bleeding, unspecified: Secondary | ICD-10-CM | POA: Diagnosis not present

## 2019-11-12 DIAGNOSIS — N926 Irregular menstruation, unspecified: Secondary | ICD-10-CM | POA: Diagnosis not present

## 2019-11-12 DIAGNOSIS — F321 Major depressive disorder, single episode, moderate: Secondary | ICD-10-CM | POA: Diagnosis not present

## 2019-11-12 DIAGNOSIS — F411 Generalized anxiety disorder: Secondary | ICD-10-CM | POA: Diagnosis not present

## 2019-11-12 DIAGNOSIS — R635 Abnormal weight gain: Secondary | ICD-10-CM | POA: Diagnosis not present

## 2019-11-20 DIAGNOSIS — D492 Neoplasm of unspecified behavior of bone, soft tissue, and skin: Secondary | ICD-10-CM | POA: Diagnosis not present

## 2019-11-24 DIAGNOSIS — N921 Excessive and frequent menstruation with irregular cycle: Secondary | ICD-10-CM | POA: Diagnosis not present

## 2019-11-24 DIAGNOSIS — Z793 Long term (current) use of hormonal contraceptives: Secondary | ICD-10-CM | POA: Diagnosis not present

## 2020-01-05 DIAGNOSIS — R635 Abnormal weight gain: Secondary | ICD-10-CM | POA: Diagnosis not present

## 2020-01-05 DIAGNOSIS — F411 Generalized anxiety disorder: Secondary | ICD-10-CM | POA: Diagnosis not present

## 2020-01-05 DIAGNOSIS — N925 Other specified irregular menstruation: Secondary | ICD-10-CM | POA: Diagnosis not present

## 2020-01-05 DIAGNOSIS — F321 Major depressive disorder, single episode, moderate: Secondary | ICD-10-CM | POA: Diagnosis not present

## 2020-01-07 DIAGNOSIS — Z6824 Body mass index (BMI) 24.0-24.9, adult: Secondary | ICD-10-CM | POA: Diagnosis not present

## 2020-01-07 DIAGNOSIS — Z13220 Encounter for screening for lipoid disorders: Secondary | ICD-10-CM | POA: Diagnosis not present

## 2020-01-07 DIAGNOSIS — Z713 Dietary counseling and surveillance: Secondary | ICD-10-CM | POA: Diagnosis not present

## 2020-02-29 DIAGNOSIS — E782 Mixed hyperlipidemia: Secondary | ICD-10-CM | POA: Diagnosis not present

## 2020-02-29 DIAGNOSIS — F411 Generalized anxiety disorder: Secondary | ICD-10-CM | POA: Diagnosis not present

## 2020-02-29 DIAGNOSIS — R635 Abnormal weight gain: Secondary | ICD-10-CM | POA: Diagnosis not present

## 2020-02-29 DIAGNOSIS — F321 Major depressive disorder, single episode, moderate: Secondary | ICD-10-CM | POA: Diagnosis not present

## 2020-03-03 DIAGNOSIS — Z03818 Encounter for observation for suspected exposure to other biological agents ruled out: Secondary | ICD-10-CM | POA: Diagnosis not present

## 2020-03-03 DIAGNOSIS — Z20822 Contact with and (suspected) exposure to covid-19: Secondary | ICD-10-CM | POA: Diagnosis not present

## 2020-05-23 DIAGNOSIS — E782 Mixed hyperlipidemia: Secondary | ICD-10-CM | POA: Diagnosis not present

## 2020-05-23 DIAGNOSIS — F411 Generalized anxiety disorder: Secondary | ICD-10-CM | POA: Diagnosis not present

## 2020-05-23 DIAGNOSIS — F321 Major depressive disorder, single episode, moderate: Secondary | ICD-10-CM | POA: Diagnosis not present

## 2020-05-27 DIAGNOSIS — F321 Major depressive disorder, single episode, moderate: Secondary | ICD-10-CM | POA: Diagnosis not present

## 2020-05-27 DIAGNOSIS — Z87891 Personal history of nicotine dependence: Secondary | ICD-10-CM | POA: Diagnosis not present

## 2020-06-02 DIAGNOSIS — F411 Generalized anxiety disorder: Secondary | ICD-10-CM | POA: Diagnosis not present

## 2020-06-02 DIAGNOSIS — F321 Major depressive disorder, single episode, moderate: Secondary | ICD-10-CM | POA: Diagnosis not present

## 2020-06-08 DIAGNOSIS — F329 Major depressive disorder, single episode, unspecified: Secondary | ICD-10-CM | POA: Diagnosis not present

## 2020-06-14 DIAGNOSIS — F321 Major depressive disorder, single episode, moderate: Secondary | ICD-10-CM | POA: Diagnosis not present

## 2020-06-14 DIAGNOSIS — F411 Generalized anxiety disorder: Secondary | ICD-10-CM | POA: Diagnosis not present

## 2020-06-20 DIAGNOSIS — F411 Generalized anxiety disorder: Secondary | ICD-10-CM | POA: Diagnosis not present

## 2020-06-20 DIAGNOSIS — F321 Major depressive disorder, single episode, moderate: Secondary | ICD-10-CM | POA: Diagnosis not present

## 2020-06-28 DIAGNOSIS — F329 Major depressive disorder, single episode, unspecified: Secondary | ICD-10-CM | POA: Diagnosis not present

## 2020-06-30 DIAGNOSIS — F321 Major depressive disorder, single episode, moderate: Secondary | ICD-10-CM | POA: Diagnosis not present

## 2020-06-30 DIAGNOSIS — E538 Deficiency of other specified B group vitamins: Secondary | ICD-10-CM | POA: Diagnosis not present

## 2020-07-01 DIAGNOSIS — F321 Major depressive disorder, single episode, moderate: Secondary | ICD-10-CM | POA: Diagnosis not present

## 2020-07-01 DIAGNOSIS — E538 Deficiency of other specified B group vitamins: Secondary | ICD-10-CM | POA: Diagnosis not present

## 2020-07-21 DIAGNOSIS — F329 Major depressive disorder, single episode, unspecified: Secondary | ICD-10-CM | POA: Diagnosis not present

## 2020-07-21 DIAGNOSIS — F419 Anxiety disorder, unspecified: Secondary | ICD-10-CM | POA: Diagnosis not present

## 2020-07-27 DIAGNOSIS — F419 Anxiety disorder, unspecified: Secondary | ICD-10-CM | POA: Diagnosis not present

## 2020-07-27 DIAGNOSIS — F329 Major depressive disorder, single episode, unspecified: Secondary | ICD-10-CM | POA: Diagnosis not present

## 2020-07-30 DIAGNOSIS — F332 Major depressive disorder, recurrent severe without psychotic features: Secondary | ICD-10-CM | POA: Diagnosis not present

## 2020-08-03 DIAGNOSIS — F418 Other specified anxiety disorders: Secondary | ICD-10-CM | POA: Diagnosis not present

## 2020-08-03 DIAGNOSIS — F332 Major depressive disorder, recurrent severe without psychotic features: Secondary | ICD-10-CM | POA: Diagnosis not present

## 2020-08-10 DIAGNOSIS — F322 Major depressive disorder, single episode, severe without psychotic features: Secondary | ICD-10-CM | POA: Diagnosis not present

## 2020-08-22 DIAGNOSIS — F332 Major depressive disorder, recurrent severe without psychotic features: Secondary | ICD-10-CM | POA: Diagnosis not present

## 2020-09-02 DIAGNOSIS — R238 Other skin changes: Secondary | ICD-10-CM | POA: Diagnosis not present

## 2020-09-02 DIAGNOSIS — R070 Pain in throat: Secondary | ICD-10-CM | POA: Diagnosis not present

## 2020-09-06 DIAGNOSIS — U071 COVID-19: Secondary | ICD-10-CM | POA: Diagnosis not present

## 2020-09-12 DIAGNOSIS — F324 Major depressive disorder, single episode, in partial remission: Secondary | ICD-10-CM | POA: Diagnosis not present

## 2020-09-13 DIAGNOSIS — L249 Irritant contact dermatitis, unspecified cause: Secondary | ICD-10-CM | POA: Diagnosis not present

## 2020-09-13 DIAGNOSIS — L710 Perioral dermatitis: Secondary | ICD-10-CM | POA: Diagnosis not present

## 2020-09-20 DIAGNOSIS — F332 Major depressive disorder, recurrent severe without psychotic features: Secondary | ICD-10-CM | POA: Diagnosis not present

## 2020-09-22 DIAGNOSIS — L309 Dermatitis, unspecified: Secondary | ICD-10-CM | POA: Diagnosis not present

## 2020-09-22 DIAGNOSIS — R635 Abnormal weight gain: Secondary | ICD-10-CM | POA: Diagnosis not present

## 2020-09-22 DIAGNOSIS — Z0189 Encounter for other specified special examinations: Secondary | ICD-10-CM | POA: Diagnosis not present

## 2020-09-22 DIAGNOSIS — L710 Perioral dermatitis: Secondary | ICD-10-CM | POA: Diagnosis not present

## 2020-09-22 DIAGNOSIS — Z6825 Body mass index (BMI) 25.0-25.9, adult: Secondary | ICD-10-CM | POA: Diagnosis not present

## 2020-09-28 DIAGNOSIS — F324 Major depressive disorder, single episode, in partial remission: Secondary | ICD-10-CM | POA: Diagnosis not present

## 2020-09-28 DIAGNOSIS — F332 Major depressive disorder, recurrent severe without psychotic features: Secondary | ICD-10-CM | POA: Diagnosis not present

## 2020-09-28 DIAGNOSIS — F418 Other specified anxiety disorders: Secondary | ICD-10-CM | POA: Diagnosis not present

## 2020-10-05 DIAGNOSIS — F332 Major depressive disorder, recurrent severe without psychotic features: Secondary | ICD-10-CM | POA: Diagnosis not present

## 2020-10-10 DIAGNOSIS — E782 Mixed hyperlipidemia: Secondary | ICD-10-CM | POA: Diagnosis not present

## 2020-10-10 DIAGNOSIS — B359 Dermatophytosis, unspecified: Secondary | ICD-10-CM | POA: Diagnosis not present

## 2020-10-13 DIAGNOSIS — F324 Major depressive disorder, single episode, in partial remission: Secondary | ICD-10-CM | POA: Diagnosis not present

## 2020-10-13 DIAGNOSIS — F418 Other specified anxiety disorders: Secondary | ICD-10-CM | POA: Diagnosis not present

## 2020-10-20 DIAGNOSIS — Z0189 Encounter for other specified special examinations: Secondary | ICD-10-CM | POA: Diagnosis not present

## 2020-10-20 DIAGNOSIS — K5190 Ulcerative colitis, unspecified, without complications: Secondary | ICD-10-CM | POA: Diagnosis not present

## 2020-11-10 DIAGNOSIS — Z309 Encounter for contraceptive management, unspecified: Secondary | ICD-10-CM | POA: Diagnosis not present

## 2020-11-10 DIAGNOSIS — Z803 Family history of malignant neoplasm of breast: Secondary | ICD-10-CM | POA: Diagnosis not present

## 2020-11-10 DIAGNOSIS — Z124 Encounter for screening for malignant neoplasm of cervix: Secondary | ICD-10-CM | POA: Diagnosis not present

## 2020-11-10 DIAGNOSIS — Z01419 Encounter for gynecological examination (general) (routine) without abnormal findings: Secondary | ICD-10-CM | POA: Diagnosis not present

## 2020-11-18 DIAGNOSIS — J029 Acute pharyngitis, unspecified: Secondary | ICD-10-CM | POA: Diagnosis not present

## 2020-11-18 DIAGNOSIS — Z20822 Contact with and (suspected) exposure to covid-19: Secondary | ICD-10-CM | POA: Diagnosis not present

## 2020-11-18 DIAGNOSIS — Z03818 Encounter for observation for suspected exposure to other biological agents ruled out: Secondary | ICD-10-CM | POA: Diagnosis not present

## 2020-11-24 DIAGNOSIS — L659 Nonscarring hair loss, unspecified: Secondary | ICD-10-CM | POA: Diagnosis not present

## 2020-11-24 DIAGNOSIS — Z7689 Persons encountering health services in other specified circumstances: Secondary | ICD-10-CM | POA: Diagnosis not present

## 2020-11-24 DIAGNOSIS — R635 Abnormal weight gain: Secondary | ICD-10-CM | POA: Diagnosis not present

## 2020-12-01 DIAGNOSIS — Z30430 Encounter for insertion of intrauterine contraceptive device: Secondary | ICD-10-CM | POA: Diagnosis not present

## 2020-12-14 DIAGNOSIS — F418 Other specified anxiety disorders: Secondary | ICD-10-CM | POA: Diagnosis not present

## 2020-12-14 DIAGNOSIS — F324 Major depressive disorder, single episode, in partial remission: Secondary | ICD-10-CM | POA: Diagnosis not present

## 2020-12-29 DIAGNOSIS — F418 Other specified anxiety disorders: Secondary | ICD-10-CM | POA: Diagnosis not present

## 2020-12-29 DIAGNOSIS — F324 Major depressive disorder, single episode, in partial remission: Secondary | ICD-10-CM | POA: Diagnosis not present

## 2021-01-10 DIAGNOSIS — Z7689 Persons encountering health services in other specified circumstances: Secondary | ICD-10-CM | POA: Diagnosis not present

## 2021-01-10 DIAGNOSIS — F321 Major depressive disorder, single episode, moderate: Secondary | ICD-10-CM | POA: Diagnosis not present

## 2021-01-10 DIAGNOSIS — E063 Autoimmune thyroiditis: Secondary | ICD-10-CM | POA: Diagnosis not present

## 2021-01-12 DIAGNOSIS — F332 Major depressive disorder, recurrent severe without psychotic features: Secondary | ICD-10-CM | POA: Diagnosis not present

## 2021-01-17 DIAGNOSIS — E038 Other specified hypothyroidism: Secondary | ICD-10-CM | POA: Diagnosis not present

## 2021-01-17 DIAGNOSIS — E063 Autoimmune thyroiditis: Secondary | ICD-10-CM | POA: Diagnosis not present

## 2021-01-18 DIAGNOSIS — Z30432 Encounter for removal of intrauterine contraceptive device: Secondary | ICD-10-CM | POA: Diagnosis not present

## 2021-01-31 DIAGNOSIS — N898 Other specified noninflammatory disorders of vagina: Secondary | ICD-10-CM | POA: Diagnosis not present

## 2021-02-03 DIAGNOSIS — E063 Autoimmune thyroiditis: Secondary | ICD-10-CM | POA: Diagnosis not present

## 2021-02-03 DIAGNOSIS — M791 Myalgia, unspecified site: Secondary | ICD-10-CM | POA: Diagnosis not present

## 2021-02-03 DIAGNOSIS — R5383 Other fatigue: Secondary | ICD-10-CM | POA: Diagnosis not present

## 2021-02-03 DIAGNOSIS — L659 Nonscarring hair loss, unspecified: Secondary | ICD-10-CM | POA: Diagnosis not present

## 2021-04-04 DIAGNOSIS — F411 Generalized anxiety disorder: Secondary | ICD-10-CM | POA: Diagnosis not present

## 2021-04-04 DIAGNOSIS — F332 Major depressive disorder, recurrent severe without psychotic features: Secondary | ICD-10-CM | POA: Diagnosis not present

## 2021-04-04 DIAGNOSIS — F3342 Major depressive disorder, recurrent, in full remission: Secondary | ICD-10-CM | POA: Diagnosis not present

## 2021-05-01 DIAGNOSIS — F411 Generalized anxiety disorder: Secondary | ICD-10-CM | POA: Diagnosis not present

## 2021-05-01 DIAGNOSIS — F3342 Major depressive disorder, recurrent, in full remission: Secondary | ICD-10-CM | POA: Diagnosis not present

## 2021-05-05 DIAGNOSIS — F324 Major depressive disorder, single episode, in partial remission: Secondary | ICD-10-CM | POA: Diagnosis not present

## 2021-05-05 DIAGNOSIS — F418 Other specified anxiety disorders: Secondary | ICD-10-CM | POA: Diagnosis not present

## 2021-05-09 DIAGNOSIS — F418 Other specified anxiety disorders: Secondary | ICD-10-CM | POA: Diagnosis not present

## 2021-05-09 DIAGNOSIS — F324 Major depressive disorder, single episode, in partial remission: Secondary | ICD-10-CM | POA: Diagnosis not present

## 2021-05-23 DIAGNOSIS — F324 Major depressive disorder, single episode, in partial remission: Secondary | ICD-10-CM | POA: Diagnosis not present

## 2021-05-23 DIAGNOSIS — F418 Other specified anxiety disorders: Secondary | ICD-10-CM | POA: Diagnosis not present

## 2021-05-29 DIAGNOSIS — F3342 Major depressive disorder, recurrent, in full remission: Secondary | ICD-10-CM | POA: Diagnosis not present

## 2021-05-29 DIAGNOSIS — F411 Generalized anxiety disorder: Secondary | ICD-10-CM | POA: Diagnosis not present

## 2021-06-16 DIAGNOSIS — F324 Major depressive disorder, single episode, in partial remission: Secondary | ICD-10-CM | POA: Diagnosis not present

## 2021-06-16 DIAGNOSIS — F418 Other specified anxiety disorders: Secondary | ICD-10-CM | POA: Diagnosis not present

## 2021-06-19 DIAGNOSIS — F411 Generalized anxiety disorder: Secondary | ICD-10-CM | POA: Diagnosis not present

## 2021-06-19 DIAGNOSIS — F3342 Major depressive disorder, recurrent, in full remission: Secondary | ICD-10-CM | POA: Diagnosis not present

## 2021-06-27 DIAGNOSIS — F418 Other specified anxiety disorders: Secondary | ICD-10-CM | POA: Diagnosis not present

## 2021-06-27 DIAGNOSIS — F324 Major depressive disorder, single episode, in partial remission: Secondary | ICD-10-CM | POA: Diagnosis not present

## 2021-07-05 DIAGNOSIS — F324 Major depressive disorder, single episode, in partial remission: Secondary | ICD-10-CM | POA: Diagnosis not present

## 2021-07-05 DIAGNOSIS — F411 Generalized anxiety disorder: Secondary | ICD-10-CM | POA: Diagnosis not present

## 2021-07-07 DIAGNOSIS — F411 Generalized anxiety disorder: Secondary | ICD-10-CM | POA: Diagnosis not present

## 2021-07-17 DIAGNOSIS — Z7689 Persons encountering health services in other specified circumstances: Secondary | ICD-10-CM | POA: Diagnosis not present

## 2021-07-17 DIAGNOSIS — Z113 Encounter for screening for infections with a predominantly sexual mode of transmission: Secondary | ICD-10-CM | POA: Diagnosis not present

## 2021-07-17 DIAGNOSIS — Z6825 Body mass index (BMI) 25.0-25.9, adult: Secondary | ICD-10-CM | POA: Diagnosis not present

## 2021-07-17 DIAGNOSIS — E038 Other specified hypothyroidism: Secondary | ICD-10-CM | POA: Diagnosis not present

## 2021-07-17 DIAGNOSIS — E663 Overweight: Secondary | ICD-10-CM | POA: Diagnosis not present

## 2021-07-17 DIAGNOSIS — E782 Mixed hyperlipidemia: Secondary | ICD-10-CM | POA: Diagnosis not present

## 2021-07-17 DIAGNOSIS — E063 Autoimmune thyroiditis: Secondary | ICD-10-CM | POA: Diagnosis not present

## 2021-07-20 DIAGNOSIS — F418 Other specified anxiety disorders: Secondary | ICD-10-CM | POA: Diagnosis not present

## 2021-07-20 DIAGNOSIS — F324 Major depressive disorder, single episode, in partial remission: Secondary | ICD-10-CM | POA: Diagnosis not present

## 2021-07-21 DIAGNOSIS — F411 Generalized anxiety disorder: Secondary | ICD-10-CM | POA: Diagnosis not present

## 2021-07-21 DIAGNOSIS — F321 Major depressive disorder, single episode, moderate: Secondary | ICD-10-CM | POA: Diagnosis not present

## 2021-07-26 DIAGNOSIS — F324 Major depressive disorder, single episode, in partial remission: Secondary | ICD-10-CM | POA: Diagnosis not present

## 2021-07-26 DIAGNOSIS — F411 Generalized anxiety disorder: Secondary | ICD-10-CM | POA: Diagnosis not present

## 2021-08-07 DIAGNOSIS — F411 Generalized anxiety disorder: Secondary | ICD-10-CM | POA: Diagnosis not present

## 2021-08-07 DIAGNOSIS — F332 Major depressive disorder, recurrent severe without psychotic features: Secondary | ICD-10-CM | POA: Diagnosis not present

## 2021-08-21 DIAGNOSIS — Z3202 Encounter for pregnancy test, result negative: Secondary | ICD-10-CM | POA: Diagnosis not present

## 2021-08-21 DIAGNOSIS — N939 Abnormal uterine and vaginal bleeding, unspecified: Secondary | ICD-10-CM | POA: Diagnosis not present

## 2021-08-21 DIAGNOSIS — B9689 Other specified bacterial agents as the cause of diseases classified elsewhere: Secondary | ICD-10-CM | POA: Diagnosis not present

## 2021-08-21 DIAGNOSIS — Z9189 Other specified personal risk factors, not elsewhere classified: Secondary | ICD-10-CM | POA: Diagnosis not present

## 2021-08-21 DIAGNOSIS — R350 Frequency of micturition: Secondary | ICD-10-CM | POA: Diagnosis not present

## 2021-08-21 DIAGNOSIS — N760 Acute vaginitis: Secondary | ICD-10-CM | POA: Diagnosis not present

## 2021-08-29 DIAGNOSIS — F418 Other specified anxiety disorders: Secondary | ICD-10-CM | POA: Diagnosis not present

## 2021-08-29 DIAGNOSIS — F325 Major depressive disorder, single episode, in full remission: Secondary | ICD-10-CM | POA: Diagnosis not present

## 2021-09-01 DIAGNOSIS — F332 Major depressive disorder, recurrent severe without psychotic features: Secondary | ICD-10-CM | POA: Diagnosis not present

## 2021-09-01 DIAGNOSIS — F411 Generalized anxiety disorder: Secondary | ICD-10-CM | POA: Diagnosis not present

## 2021-09-09 DIAGNOSIS — N390 Urinary tract infection, site not specified: Secondary | ICD-10-CM | POA: Diagnosis not present

## 2021-09-09 DIAGNOSIS — R102 Pelvic and perineal pain: Secondary | ICD-10-CM | POA: Diagnosis not present

## 2021-09-09 DIAGNOSIS — N760 Acute vaginitis: Secondary | ICD-10-CM | POA: Diagnosis not present

## 2021-09-09 DIAGNOSIS — B9689 Other specified bacterial agents as the cause of diseases classified elsewhere: Secondary | ICD-10-CM | POA: Diagnosis not present

## 2021-09-09 DIAGNOSIS — R300 Dysuria: Secondary | ICD-10-CM | POA: Diagnosis not present

## 2021-09-14 DIAGNOSIS — F411 Generalized anxiety disorder: Secondary | ICD-10-CM | POA: Diagnosis not present

## 2021-09-14 DIAGNOSIS — F332 Major depressive disorder, recurrent severe without psychotic features: Secondary | ICD-10-CM | POA: Diagnosis not present

## 2021-09-21 DIAGNOSIS — L710 Perioral dermatitis: Secondary | ICD-10-CM | POA: Diagnosis not present

## 2021-09-26 DIAGNOSIS — F411 Generalized anxiety disorder: Secondary | ICD-10-CM | POA: Diagnosis not present

## 2021-09-26 DIAGNOSIS — F332 Major depressive disorder, recurrent severe without psychotic features: Secondary | ICD-10-CM | POA: Diagnosis not present

## 2021-10-12 DIAGNOSIS — N925 Other specified irregular menstruation: Secondary | ICD-10-CM | POA: Diagnosis not present

## 2021-10-17 DIAGNOSIS — F332 Major depressive disorder, recurrent severe without psychotic features: Secondary | ICD-10-CM | POA: Diagnosis not present

## 2021-10-17 DIAGNOSIS — F411 Generalized anxiety disorder: Secondary | ICD-10-CM | POA: Diagnosis not present

## 2021-10-24 DIAGNOSIS — Z113 Encounter for screening for infections with a predominantly sexual mode of transmission: Secondary | ICD-10-CM | POA: Diagnosis not present

## 2021-10-24 DIAGNOSIS — N760 Acute vaginitis: Secondary | ICD-10-CM | POA: Diagnosis not present

## 2021-10-24 DIAGNOSIS — N926 Irregular menstruation, unspecified: Secondary | ICD-10-CM | POA: Diagnosis not present

## 2021-10-28 DIAGNOSIS — F419 Anxiety disorder, unspecified: Secondary | ICD-10-CM | POA: Diagnosis not present

## 2021-10-28 DIAGNOSIS — K5190 Ulcerative colitis, unspecified, without complications: Secondary | ICD-10-CM | POA: Diagnosis not present

## 2021-10-28 DIAGNOSIS — R45851 Suicidal ideations: Secondary | ICD-10-CM | POA: Diagnosis not present

## 2021-10-28 DIAGNOSIS — Z8659 Personal history of other mental and behavioral disorders: Secondary | ICD-10-CM | POA: Diagnosis not present

## 2021-10-28 DIAGNOSIS — Z8719 Personal history of other diseases of the digestive system: Secondary | ICD-10-CM | POA: Diagnosis not present

## 2021-10-28 DIAGNOSIS — E039 Hypothyroidism, unspecified: Secondary | ICD-10-CM | POA: Diagnosis not present

## 2021-10-28 DIAGNOSIS — F332 Major depressive disorder, recurrent severe without psychotic features: Secondary | ICD-10-CM | POA: Diagnosis not present

## 2021-11-02 DIAGNOSIS — F411 Generalized anxiety disorder: Secondary | ICD-10-CM | POA: Diagnosis not present

## 2021-11-02 DIAGNOSIS — F3181 Bipolar II disorder: Secondary | ICD-10-CM | POA: Diagnosis not present

## 2021-11-08 DIAGNOSIS — Z3201 Encounter for pregnancy test, result positive: Secondary | ICD-10-CM | POA: Diagnosis not present

## 2021-11-08 DIAGNOSIS — N390 Urinary tract infection, site not specified: Secondary | ICD-10-CM | POA: Diagnosis not present

## 2021-11-13 DIAGNOSIS — F325 Major depressive disorder, single episode, in full remission: Secondary | ICD-10-CM | POA: Diagnosis not present

## 2021-11-13 DIAGNOSIS — F418 Other specified anxiety disorders: Secondary | ICD-10-CM | POA: Diagnosis not present

## 2021-11-15 DIAGNOSIS — O200 Threatened abortion: Secondary | ICD-10-CM | POA: Diagnosis not present

## 2021-11-17 DIAGNOSIS — F411 Generalized anxiety disorder: Secondary | ICD-10-CM | POA: Diagnosis not present

## 2021-11-17 DIAGNOSIS — F3181 Bipolar II disorder: Secondary | ICD-10-CM | POA: Diagnosis not present

## 2021-11-23 DIAGNOSIS — Z3683 Encounter for fetal screening for congenital cardiac abnormalities: Secondary | ICD-10-CM | POA: Diagnosis not present

## 2021-11-28 DIAGNOSIS — O021 Missed abortion: Secondary | ICD-10-CM | POA: Diagnosis not present

## 2021-12-04 DIAGNOSIS — F39 Unspecified mood [affective] disorder: Secondary | ICD-10-CM | POA: Diagnosis not present

## 2021-12-05 DIAGNOSIS — F319 Bipolar disorder, unspecified: Secondary | ICD-10-CM | POA: Diagnosis not present

## 2021-12-05 DIAGNOSIS — F418 Other specified anxiety disorders: Secondary | ICD-10-CM | POA: Diagnosis not present

## 2021-12-05 DIAGNOSIS — O021 Missed abortion: Secondary | ICD-10-CM | POA: Diagnosis not present

## 2021-12-11 DIAGNOSIS — F411 Generalized anxiety disorder: Secondary | ICD-10-CM | POA: Diagnosis not present

## 2021-12-11 DIAGNOSIS — F418 Other specified anxiety disorders: Secondary | ICD-10-CM | POA: Diagnosis not present

## 2021-12-11 DIAGNOSIS — F319 Bipolar disorder, unspecified: Secondary | ICD-10-CM | POA: Diagnosis not present

## 2021-12-11 DIAGNOSIS — F39 Unspecified mood [affective] disorder: Secondary | ICD-10-CM | POA: Diagnosis not present

## 2021-12-12 DIAGNOSIS — O021 Missed abortion: Secondary | ICD-10-CM | POA: Diagnosis not present

## 2021-12-14 DIAGNOSIS — R350 Frequency of micturition: Secondary | ICD-10-CM | POA: Diagnosis not present

## 2021-12-14 DIAGNOSIS — L293 Anogenital pruritus, unspecified: Secondary | ICD-10-CM | POA: Diagnosis not present

## 2021-12-14 DIAGNOSIS — N898 Other specified noninflammatory disorders of vagina: Secondary | ICD-10-CM | POA: Diagnosis not present

## 2021-12-19 DIAGNOSIS — F411 Generalized anxiety disorder: Secondary | ICD-10-CM | POA: Diagnosis not present

## 2021-12-19 DIAGNOSIS — F39 Unspecified mood [affective] disorder: Secondary | ICD-10-CM | POA: Diagnosis not present

## 2021-12-20 DIAGNOSIS — Z01419 Encounter for gynecological examination (general) (routine) without abnormal findings: Secondary | ICD-10-CM | POA: Diagnosis not present

## 2021-12-20 DIAGNOSIS — R102 Pelvic and perineal pain: Secondary | ICD-10-CM | POA: Diagnosis not present

## 2021-12-26 DIAGNOSIS — E039 Hypothyroidism, unspecified: Secondary | ICD-10-CM | POA: Diagnosis not present

## 2021-12-26 DIAGNOSIS — O011 Incomplete and partial hydatidiform mole: Secondary | ICD-10-CM | POA: Diagnosis not present

## 2021-12-26 DIAGNOSIS — Z309 Encounter for contraceptive management, unspecified: Secondary | ICD-10-CM | POA: Diagnosis not present

## 2021-12-26 DIAGNOSIS — R350 Frequency of micturition: Secondary | ICD-10-CM | POA: Diagnosis not present

## 2021-12-28 DIAGNOSIS — F39 Unspecified mood [affective] disorder: Secondary | ICD-10-CM | POA: Diagnosis not present

## 2021-12-28 DIAGNOSIS — F411 Generalized anxiety disorder: Secondary | ICD-10-CM | POA: Diagnosis not present

## 2022-01-04 DIAGNOSIS — F411 Generalized anxiety disorder: Secondary | ICD-10-CM | POA: Diagnosis not present

## 2022-01-04 DIAGNOSIS — F39 Unspecified mood [affective] disorder: Secondary | ICD-10-CM | POA: Diagnosis not present

## 2022-01-08 DIAGNOSIS — F411 Generalized anxiety disorder: Secondary | ICD-10-CM | POA: Diagnosis not present

## 2022-01-08 DIAGNOSIS — E038 Other specified hypothyroidism: Secondary | ICD-10-CM | POA: Diagnosis not present

## 2022-01-08 DIAGNOSIS — F322 Major depressive disorder, single episode, severe without psychotic features: Secondary | ICD-10-CM | POA: Diagnosis not present

## 2022-01-08 DIAGNOSIS — E063 Autoimmune thyroiditis: Secondary | ICD-10-CM | POA: Diagnosis not present

## 2022-01-08 DIAGNOSIS — F418 Other specified anxiety disorders: Secondary | ICD-10-CM | POA: Diagnosis not present

## 2022-01-08 DIAGNOSIS — F319 Bipolar disorder, unspecified: Secondary | ICD-10-CM | POA: Diagnosis not present

## 2022-01-09 DIAGNOSIS — F39 Unspecified mood [affective] disorder: Secondary | ICD-10-CM | POA: Diagnosis not present

## 2022-01-09 DIAGNOSIS — F411 Generalized anxiety disorder: Secondary | ICD-10-CM | POA: Diagnosis not present

## 2022-01-15 DIAGNOSIS — F5101 Primary insomnia: Secondary | ICD-10-CM | POA: Diagnosis not present

## 2022-01-15 DIAGNOSIS — F411 Generalized anxiety disorder: Secondary | ICD-10-CM | POA: Diagnosis not present

## 2022-01-15 DIAGNOSIS — F322 Major depressive disorder, single episode, severe without psychotic features: Secondary | ICD-10-CM | POA: Diagnosis not present

## 2022-01-16 DIAGNOSIS — F39 Unspecified mood [affective] disorder: Secondary | ICD-10-CM | POA: Diagnosis not present

## 2022-01-16 DIAGNOSIS — F411 Generalized anxiety disorder: Secondary | ICD-10-CM | POA: Diagnosis not present

## 2022-01-16 DIAGNOSIS — F332 Major depressive disorder, recurrent severe without psychotic features: Secondary | ICD-10-CM | POA: Diagnosis not present

## 2022-01-23 DIAGNOSIS — F332 Major depressive disorder, recurrent severe without psychotic features: Secondary | ICD-10-CM | POA: Diagnosis not present

## 2022-01-24 DIAGNOSIS — R42 Dizziness and giddiness: Secondary | ICD-10-CM | POA: Diagnosis not present

## 2022-01-24 DIAGNOSIS — F411 Generalized anxiety disorder: Secondary | ICD-10-CM | POA: Diagnosis not present

## 2022-01-24 DIAGNOSIS — F5101 Primary insomnia: Secondary | ICD-10-CM | POA: Diagnosis not present

## 2022-01-24 DIAGNOSIS — F322 Major depressive disorder, single episode, severe without psychotic features: Secondary | ICD-10-CM | POA: Diagnosis not present

## 2022-01-26 DIAGNOSIS — Z113 Encounter for screening for infections with a predominantly sexual mode of transmission: Secondary | ICD-10-CM | POA: Diagnosis not present

## 2022-01-26 DIAGNOSIS — Z8759 Personal history of other complications of pregnancy, childbirth and the puerperium: Secondary | ICD-10-CM | POA: Diagnosis not present

## 2022-01-30 DIAGNOSIS — Z79899 Other long term (current) drug therapy: Secondary | ICD-10-CM | POA: Diagnosis not present

## 2022-01-30 DIAGNOSIS — Z888 Allergy status to other drugs, medicaments and biological substances status: Secondary | ICD-10-CM | POA: Diagnosis not present

## 2022-01-30 DIAGNOSIS — F53 Postpartum depression: Secondary | ICD-10-CM | POA: Diagnosis not present

## 2022-01-30 DIAGNOSIS — Z1152 Encounter for screening for COVID-19: Secondary | ICD-10-CM | POA: Diagnosis not present

## 2022-01-30 DIAGNOSIS — F329 Major depressive disorder, single episode, unspecified: Secondary | ICD-10-CM | POA: Diagnosis not present

## 2022-01-30 DIAGNOSIS — R45851 Suicidal ideations: Secondary | ICD-10-CM | POA: Diagnosis not present

## 2022-01-30 DIAGNOSIS — Z20822 Contact with and (suspected) exposure to covid-19: Secondary | ICD-10-CM | POA: Diagnosis not present

## 2022-01-30 DIAGNOSIS — Z881 Allergy status to other antibiotic agents status: Secondary | ICD-10-CM | POA: Diagnosis not present

## 2022-01-30 DIAGNOSIS — F332 Major depressive disorder, recurrent severe without psychotic features: Secondary | ICD-10-CM | POA: Diagnosis not present

## 2022-01-31 DIAGNOSIS — K219 Gastro-esophageal reflux disease without esophagitis: Secondary | ICD-10-CM | POA: Diagnosis not present

## 2022-01-31 DIAGNOSIS — R8290 Unspecified abnormal findings in urine: Secondary | ICD-10-CM | POA: Diagnosis not present

## 2022-01-31 DIAGNOSIS — J302 Other seasonal allergic rhinitis: Secondary | ICD-10-CM | POA: Diagnosis not present

## 2022-01-31 DIAGNOSIS — F332 Major depressive disorder, recurrent severe without psychotic features: Secondary | ICD-10-CM | POA: Diagnosis not present

## 2022-01-31 DIAGNOSIS — E038 Other specified hypothyroidism: Secondary | ICD-10-CM | POA: Diagnosis not present

## 2022-01-31 DIAGNOSIS — E039 Hypothyroidism, unspecified: Secondary | ICD-10-CM | POA: Diagnosis not present

## 2022-01-31 DIAGNOSIS — D6489 Other specified anemias: Secondary | ICD-10-CM | POA: Diagnosis not present

## 2022-01-31 DIAGNOSIS — Z3041 Encounter for surveillance of contraceptive pills: Secondary | ICD-10-CM | POA: Diagnosis not present

## 2022-01-31 DIAGNOSIS — Z20822 Contact with and (suspected) exposure to covid-19: Secondary | ICD-10-CM | POA: Diagnosis not present

## 2022-01-31 DIAGNOSIS — F419 Anxiety disorder, unspecified: Secondary | ICD-10-CM | POA: Diagnosis not present

## 2022-01-31 DIAGNOSIS — R52 Pain, unspecified: Secondary | ICD-10-CM | POA: Diagnosis not present

## 2022-01-31 DIAGNOSIS — G4700 Insomnia, unspecified: Secondary | ICD-10-CM | POA: Diagnosis not present

## 2022-01-31 DIAGNOSIS — D649 Anemia, unspecified: Secondary | ICD-10-CM | POA: Diagnosis not present

## 2022-01-31 DIAGNOSIS — R45851 Suicidal ideations: Secondary | ICD-10-CM | POA: Diagnosis not present

## 2022-01-31 DIAGNOSIS — K2100 Gastro-esophageal reflux disease with esophagitis, without bleeding: Secondary | ICD-10-CM | POA: Diagnosis not present

## 2022-01-31 DIAGNOSIS — K5900 Constipation, unspecified: Secondary | ICD-10-CM | POA: Diagnosis not present

## 2022-01-31 DIAGNOSIS — E785 Hyperlipidemia, unspecified: Secondary | ICD-10-CM | POA: Diagnosis not present

## 2022-01-31 DIAGNOSIS — F32A Depression, unspecified: Secondary | ICD-10-CM | POA: Diagnosis not present

## 2022-02-13 DIAGNOSIS — F411 Generalized anxiety disorder: Secondary | ICD-10-CM | POA: Diagnosis not present

## 2022-02-13 DIAGNOSIS — F39 Unspecified mood [affective] disorder: Secondary | ICD-10-CM | POA: Diagnosis not present

## 2022-02-15 DIAGNOSIS — F339 Major depressive disorder, recurrent, unspecified: Secondary | ICD-10-CM | POA: Diagnosis not present

## 2022-02-15 DIAGNOSIS — F411 Generalized anxiety disorder: Secondary | ICD-10-CM | POA: Diagnosis not present

## 2022-02-19 DIAGNOSIS — F339 Major depressive disorder, recurrent, unspecified: Secondary | ICD-10-CM | POA: Diagnosis not present

## 2022-02-19 DIAGNOSIS — F411 Generalized anxiety disorder: Secondary | ICD-10-CM | POA: Diagnosis not present

## 2022-02-21 DIAGNOSIS — F339 Major depressive disorder, recurrent, unspecified: Secondary | ICD-10-CM | POA: Diagnosis not present

## 2022-02-21 DIAGNOSIS — F411 Generalized anxiety disorder: Secondary | ICD-10-CM | POA: Diagnosis not present

## 2022-02-22 DIAGNOSIS — F339 Major depressive disorder, recurrent, unspecified: Secondary | ICD-10-CM | POA: Diagnosis not present

## 2022-02-22 DIAGNOSIS — F411 Generalized anxiety disorder: Secondary | ICD-10-CM | POA: Diagnosis not present

## 2022-02-26 DIAGNOSIS — F339 Major depressive disorder, recurrent, unspecified: Secondary | ICD-10-CM | POA: Diagnosis not present

## 2022-02-26 DIAGNOSIS — F411 Generalized anxiety disorder: Secondary | ICD-10-CM | POA: Diagnosis not present

## 2022-02-27 DIAGNOSIS — N760 Acute vaginitis: Secondary | ICD-10-CM | POA: Diagnosis not present

## 2022-02-27 DIAGNOSIS — Z113 Encounter for screening for infections with a predominantly sexual mode of transmission: Secondary | ICD-10-CM | POA: Diagnosis not present

## 2022-02-28 DIAGNOSIS — F411 Generalized anxiety disorder: Secondary | ICD-10-CM | POA: Diagnosis not present

## 2022-02-28 DIAGNOSIS — F339 Major depressive disorder, recurrent, unspecified: Secondary | ICD-10-CM | POA: Diagnosis not present

## 2022-03-01 DIAGNOSIS — F1220 Cannabis dependence, uncomplicated: Secondary | ICD-10-CM | POA: Diagnosis not present

## 2022-03-01 DIAGNOSIS — F339 Major depressive disorder, recurrent, unspecified: Secondary | ICD-10-CM | POA: Diagnosis not present

## 2022-03-01 DIAGNOSIS — F17210 Nicotine dependence, cigarettes, uncomplicated: Secondary | ICD-10-CM | POA: Diagnosis not present

## 2022-03-01 DIAGNOSIS — F419 Anxiety disorder, unspecified: Secondary | ICD-10-CM | POA: Diagnosis not present

## 2022-03-01 DIAGNOSIS — F332 Major depressive disorder, recurrent severe without psychotic features: Secondary | ICD-10-CM | POA: Diagnosis not present

## 2022-03-05 DIAGNOSIS — F339 Major depressive disorder, recurrent, unspecified: Secondary | ICD-10-CM | POA: Diagnosis not present

## 2022-03-05 DIAGNOSIS — F419 Anxiety disorder, unspecified: Secondary | ICD-10-CM | POA: Diagnosis not present

## 2022-03-05 DIAGNOSIS — F17210 Nicotine dependence, cigarettes, uncomplicated: Secondary | ICD-10-CM | POA: Diagnosis not present

## 2022-03-05 DIAGNOSIS — F332 Major depressive disorder, recurrent severe without psychotic features: Secondary | ICD-10-CM | POA: Diagnosis not present

## 2022-03-05 DIAGNOSIS — F1220 Cannabis dependence, uncomplicated: Secondary | ICD-10-CM | POA: Diagnosis not present

## 2022-03-07 DIAGNOSIS — F411 Generalized anxiety disorder: Secondary | ICD-10-CM | POA: Diagnosis not present

## 2022-03-07 DIAGNOSIS — F419 Anxiety disorder, unspecified: Secondary | ICD-10-CM | POA: Diagnosis not present

## 2022-03-07 DIAGNOSIS — F1220 Cannabis dependence, uncomplicated: Secondary | ICD-10-CM | POA: Diagnosis not present

## 2022-03-07 DIAGNOSIS — F332 Major depressive disorder, recurrent severe without psychotic features: Secondary | ICD-10-CM | POA: Diagnosis not present

## 2022-03-07 DIAGNOSIS — F339 Major depressive disorder, recurrent, unspecified: Secondary | ICD-10-CM | POA: Diagnosis not present

## 2022-03-07 DIAGNOSIS — F17210 Nicotine dependence, cigarettes, uncomplicated: Secondary | ICD-10-CM | POA: Diagnosis not present

## 2022-03-08 DIAGNOSIS — F339 Major depressive disorder, recurrent, unspecified: Secondary | ICD-10-CM | POA: Diagnosis not present

## 2022-03-08 DIAGNOSIS — F419 Anxiety disorder, unspecified: Secondary | ICD-10-CM | POA: Diagnosis not present

## 2022-03-08 DIAGNOSIS — F332 Major depressive disorder, recurrent severe without psychotic features: Secondary | ICD-10-CM | POA: Diagnosis not present

## 2022-03-08 DIAGNOSIS — F17210 Nicotine dependence, cigarettes, uncomplicated: Secondary | ICD-10-CM | POA: Diagnosis not present

## 2022-03-08 DIAGNOSIS — F1220 Cannabis dependence, uncomplicated: Secondary | ICD-10-CM | POA: Diagnosis not present

## 2022-03-12 DIAGNOSIS — F332 Major depressive disorder, recurrent severe without psychotic features: Secondary | ICD-10-CM | POA: Diagnosis not present

## 2022-03-12 DIAGNOSIS — F339 Major depressive disorder, recurrent, unspecified: Secondary | ICD-10-CM | POA: Diagnosis not present

## 2022-03-12 DIAGNOSIS — F1220 Cannabis dependence, uncomplicated: Secondary | ICD-10-CM | POA: Diagnosis not present

## 2022-03-12 DIAGNOSIS — F419 Anxiety disorder, unspecified: Secondary | ICD-10-CM | POA: Diagnosis not present

## 2022-03-12 DIAGNOSIS — F17210 Nicotine dependence, cigarettes, uncomplicated: Secondary | ICD-10-CM | POA: Diagnosis not present

## 2022-03-13 DIAGNOSIS — F39 Unspecified mood [affective] disorder: Secondary | ICD-10-CM | POA: Diagnosis not present

## 2022-03-13 DIAGNOSIS — F411 Generalized anxiety disorder: Secondary | ICD-10-CM | POA: Diagnosis not present

## 2022-03-14 DIAGNOSIS — L309 Dermatitis, unspecified: Secondary | ICD-10-CM | POA: Diagnosis not present

## 2022-03-15 DIAGNOSIS — F1220 Cannabis dependence, uncomplicated: Secondary | ICD-10-CM | POA: Diagnosis not present

## 2022-03-15 DIAGNOSIS — F419 Anxiety disorder, unspecified: Secondary | ICD-10-CM | POA: Diagnosis not present

## 2022-03-15 DIAGNOSIS — F332 Major depressive disorder, recurrent severe without psychotic features: Secondary | ICD-10-CM | POA: Diagnosis not present

## 2022-03-15 DIAGNOSIS — F411 Generalized anxiety disorder: Secondary | ICD-10-CM | POA: Diagnosis not present

## 2022-03-15 DIAGNOSIS — F17210 Nicotine dependence, cigarettes, uncomplicated: Secondary | ICD-10-CM | POA: Diagnosis not present

## 2022-03-15 DIAGNOSIS — F339 Major depressive disorder, recurrent, unspecified: Secondary | ICD-10-CM | POA: Diagnosis not present

## 2022-04-03 DIAGNOSIS — F411 Generalized anxiety disorder: Secondary | ICD-10-CM | POA: Diagnosis not present

## 2022-04-03 DIAGNOSIS — F3341 Major depressive disorder, recurrent, in partial remission: Secondary | ICD-10-CM | POA: Diagnosis not present

## 2022-04-19 DIAGNOSIS — F411 Generalized anxiety disorder: Secondary | ICD-10-CM | POA: Diagnosis not present

## 2022-04-19 DIAGNOSIS — F3341 Major depressive disorder, recurrent, in partial remission: Secondary | ICD-10-CM | POA: Diagnosis not present

## 2022-04-30 DIAGNOSIS — F322 Major depressive disorder, single episode, severe without psychotic features: Secondary | ICD-10-CM | POA: Diagnosis not present

## 2022-04-30 DIAGNOSIS — E669 Obesity, unspecified: Secondary | ICD-10-CM | POA: Diagnosis not present

## 2022-04-30 DIAGNOSIS — Z7689 Persons encountering health services in other specified circumstances: Secondary | ICD-10-CM | POA: Diagnosis not present

## 2022-04-30 DIAGNOSIS — F411 Generalized anxiety disorder: Secondary | ICD-10-CM | POA: Diagnosis not present

## 2022-05-10 DIAGNOSIS — N926 Irregular menstruation, unspecified: Secondary | ICD-10-CM | POA: Diagnosis not present

## 2022-06-04 DIAGNOSIS — F411 Generalized anxiety disorder: Secondary | ICD-10-CM | POA: Diagnosis not present

## 2022-06-04 DIAGNOSIS — F3341 Major depressive disorder, recurrent, in partial remission: Secondary | ICD-10-CM | POA: Diagnosis not present

## 2022-07-03 DIAGNOSIS — Z7689 Persons encountering health services in other specified circumstances: Secondary | ICD-10-CM | POA: Diagnosis not present

## 2022-07-03 DIAGNOSIS — E669 Obesity, unspecified: Secondary | ICD-10-CM | POA: Diagnosis not present

## 2022-07-03 DIAGNOSIS — Z6828 Body mass index (BMI) 28.0-28.9, adult: Secondary | ICD-10-CM | POA: Diagnosis not present

## 2022-07-23 DIAGNOSIS — F411 Generalized anxiety disorder: Secondary | ICD-10-CM | POA: Diagnosis not present

## 2022-07-23 DIAGNOSIS — F3341 Major depressive disorder, recurrent, in partial remission: Secondary | ICD-10-CM | POA: Diagnosis not present

## 2022-08-16 DIAGNOSIS — F411 Generalized anxiety disorder: Secondary | ICD-10-CM | POA: Diagnosis not present

## 2022-08-16 DIAGNOSIS — F3341 Major depressive disorder, recurrent, in partial remission: Secondary | ICD-10-CM | POA: Diagnosis not present

## 2022-08-23 DIAGNOSIS — F411 Generalized anxiety disorder: Secondary | ICD-10-CM | POA: Diagnosis not present

## 2022-08-23 DIAGNOSIS — F422 Mixed obsessional thoughts and acts: Secondary | ICD-10-CM | POA: Diagnosis not present

## 2022-08-23 DIAGNOSIS — F3341 Major depressive disorder, recurrent, in partial remission: Secondary | ICD-10-CM | POA: Diagnosis not present

## 2022-08-31 DIAGNOSIS — H539 Unspecified visual disturbance: Secondary | ICD-10-CM | POA: Diagnosis not present

## 2022-09-18 DIAGNOSIS — F422 Mixed obsessional thoughts and acts: Secondary | ICD-10-CM | POA: Diagnosis not present

## 2022-09-18 DIAGNOSIS — F411 Generalized anxiety disorder: Secondary | ICD-10-CM | POA: Diagnosis not present

## 2022-09-18 DIAGNOSIS — F3341 Major depressive disorder, recurrent, in partial remission: Secondary | ICD-10-CM | POA: Diagnosis not present

## 2022-09-24 DIAGNOSIS — K5190 Ulcerative colitis, unspecified, without complications: Secondary | ICD-10-CM | POA: Diagnosis not present

## 2022-09-24 DIAGNOSIS — E782 Mixed hyperlipidemia: Secondary | ICD-10-CM | POA: Diagnosis not present

## 2022-09-24 DIAGNOSIS — R197 Diarrhea, unspecified: Secondary | ICD-10-CM | POA: Diagnosis not present

## 2022-09-24 DIAGNOSIS — K921 Melena: Secondary | ICD-10-CM | POA: Diagnosis not present

## 2022-09-24 DIAGNOSIS — K5120 Ulcerative (chronic) proctitis without complications: Secondary | ICD-10-CM | POA: Diagnosis not present

## 2022-09-24 DIAGNOSIS — E038 Other specified hypothyroidism: Secondary | ICD-10-CM | POA: Diagnosis not present

## 2022-09-26 DIAGNOSIS — K5190 Ulcerative colitis, unspecified, without complications: Secondary | ICD-10-CM | POA: Diagnosis not present

## 2022-09-26 DIAGNOSIS — K921 Melena: Secondary | ICD-10-CM | POA: Diagnosis not present

## 2022-09-26 DIAGNOSIS — R197 Diarrhea, unspecified: Secondary | ICD-10-CM | POA: Diagnosis not present

## 2022-09-28 DIAGNOSIS — R197 Diarrhea, unspecified: Secondary | ICD-10-CM | POA: Diagnosis not present

## 2022-10-02 DIAGNOSIS — Z9889 Other specified postprocedural states: Secondary | ICD-10-CM | POA: Diagnosis not present

## 2022-10-02 DIAGNOSIS — K529 Noninfective gastroenteritis and colitis, unspecified: Secondary | ICD-10-CM | POA: Diagnosis not present

## 2022-10-02 DIAGNOSIS — Z79899 Other long term (current) drug therapy: Secondary | ICD-10-CM | POA: Diagnosis not present

## 2022-10-02 DIAGNOSIS — Z881 Allergy status to other antibiotic agents status: Secondary | ICD-10-CM | POA: Diagnosis not present

## 2022-10-02 DIAGNOSIS — Z79890 Hormone replacement therapy (postmenopausal): Secondary | ICD-10-CM | POA: Diagnosis not present

## 2022-10-02 DIAGNOSIS — F32A Depression, unspecified: Secondary | ICD-10-CM | POA: Diagnosis not present

## 2022-10-02 DIAGNOSIS — Z7952 Long term (current) use of systemic steroids: Secondary | ICD-10-CM | POA: Diagnosis not present

## 2022-10-02 DIAGNOSIS — K5120 Ulcerative (chronic) proctitis without complications: Secondary | ICD-10-CM | POA: Diagnosis not present

## 2022-10-02 DIAGNOSIS — E785 Hyperlipidemia, unspecified: Secondary | ICD-10-CM | POA: Diagnosis not present

## 2022-10-02 DIAGNOSIS — K5190 Ulcerative colitis, unspecified, without complications: Secondary | ICD-10-CM | POA: Diagnosis not present

## 2022-10-02 DIAGNOSIS — E063 Autoimmune thyroiditis: Secondary | ICD-10-CM | POA: Diagnosis not present

## 2022-10-02 DIAGNOSIS — F418 Other specified anxiety disorders: Secondary | ICD-10-CM | POA: Diagnosis not present

## 2022-10-02 DIAGNOSIS — Z7984 Long term (current) use of oral hypoglycemic drugs: Secondary | ICD-10-CM | POA: Diagnosis not present

## 2022-10-04 DIAGNOSIS — R197 Diarrhea, unspecified: Secondary | ICD-10-CM | POA: Diagnosis not present

## 2022-10-06 DIAGNOSIS — E063 Autoimmune thyroiditis: Secondary | ICD-10-CM | POA: Diagnosis not present

## 2022-10-06 DIAGNOSIS — F32A Depression, unspecified: Secondary | ICD-10-CM | POA: Diagnosis not present

## 2022-10-06 DIAGNOSIS — Z79899 Other long term (current) drug therapy: Secondary | ICD-10-CM | POA: Diagnosis not present

## 2022-10-06 DIAGNOSIS — Z881 Allergy status to other antibiotic agents status: Secondary | ICD-10-CM | POA: Diagnosis not present

## 2022-10-06 DIAGNOSIS — E860 Dehydration: Secondary | ICD-10-CM | POA: Diagnosis not present

## 2022-10-06 DIAGNOSIS — K5120 Ulcerative (chronic) proctitis without complications: Secondary | ICD-10-CM | POA: Diagnosis not present

## 2022-10-06 DIAGNOSIS — K760 Fatty (change of) liver, not elsewhere classified: Secondary | ICD-10-CM | POA: Diagnosis not present

## 2022-10-06 DIAGNOSIS — K6389 Other specified diseases of intestine: Secondary | ICD-10-CM | POA: Diagnosis not present

## 2022-10-06 DIAGNOSIS — K639 Disease of intestine, unspecified: Secondary | ICD-10-CM | POA: Diagnosis not present

## 2022-10-06 DIAGNOSIS — Z79890 Hormone replacement therapy (postmenopausal): Secondary | ICD-10-CM | POA: Diagnosis not present

## 2022-10-06 DIAGNOSIS — K51219 Ulcerative (chronic) proctitis with unspecified complications: Secondary | ICD-10-CM | POA: Diagnosis not present

## 2022-10-06 DIAGNOSIS — R197 Diarrhea, unspecified: Secondary | ICD-10-CM | POA: Diagnosis not present

## 2022-10-10 DIAGNOSIS — Z79899 Other long term (current) drug therapy: Secondary | ICD-10-CM | POA: Diagnosis not present

## 2022-10-10 DIAGNOSIS — R197 Diarrhea, unspecified: Secondary | ICD-10-CM | POA: Diagnosis not present

## 2022-10-10 DIAGNOSIS — Z7985 Long-term (current) use of injectable non-insulin antidiabetic drugs: Secondary | ICD-10-CM | POA: Diagnosis not present

## 2022-10-10 DIAGNOSIS — Z881 Allergy status to other antibiotic agents status: Secondary | ICD-10-CM | POA: Diagnosis not present

## 2022-10-10 DIAGNOSIS — K921 Melena: Secondary | ICD-10-CM | POA: Diagnosis not present

## 2022-10-10 DIAGNOSIS — Z7952 Long term (current) use of systemic steroids: Secondary | ICD-10-CM | POA: Diagnosis not present

## 2022-10-10 DIAGNOSIS — E063 Autoimmune thyroiditis: Secondary | ICD-10-CM | POA: Diagnosis not present

## 2022-10-10 DIAGNOSIS — Z79890 Hormone replacement therapy (postmenopausal): Secondary | ICD-10-CM | POA: Diagnosis not present

## 2022-10-23 DIAGNOSIS — K5120 Ulcerative (chronic) proctitis without complications: Secondary | ICD-10-CM | POA: Diagnosis not present

## 2022-11-14 DIAGNOSIS — F422 Mixed obsessional thoughts and acts: Secondary | ICD-10-CM | POA: Diagnosis not present

## 2022-11-14 DIAGNOSIS — F411 Generalized anxiety disorder: Secondary | ICD-10-CM | POA: Diagnosis not present

## 2022-11-14 DIAGNOSIS — F3341 Major depressive disorder, recurrent, in partial remission: Secondary | ICD-10-CM | POA: Diagnosis not present

## 2022-11-15 DIAGNOSIS — Z7689 Persons encountering health services in other specified circumstances: Secondary | ICD-10-CM | POA: Diagnosis not present

## 2022-11-15 DIAGNOSIS — Z6830 Body mass index (BMI) 30.0-30.9, adult: Secondary | ICD-10-CM | POA: Diagnosis not present

## 2022-11-15 DIAGNOSIS — E669 Obesity, unspecified: Secondary | ICD-10-CM | POA: Diagnosis not present

## 2022-12-18 DIAGNOSIS — F422 Mixed obsessional thoughts and acts: Secondary | ICD-10-CM | POA: Diagnosis not present

## 2022-12-18 DIAGNOSIS — F411 Generalized anxiety disorder: Secondary | ICD-10-CM | POA: Diagnosis not present

## 2022-12-18 DIAGNOSIS — F3341 Major depressive disorder, recurrent, in partial remission: Secondary | ICD-10-CM | POA: Diagnosis not present

## 2023-02-13 DIAGNOSIS — F411 Generalized anxiety disorder: Secondary | ICD-10-CM | POA: Diagnosis not present

## 2023-02-13 DIAGNOSIS — F422 Mixed obsessional thoughts and acts: Secondary | ICD-10-CM | POA: Diagnosis not present

## 2023-02-13 DIAGNOSIS — F3341 Major depressive disorder, recurrent, in partial remission: Secondary | ICD-10-CM | POA: Diagnosis not present

## 2023-03-06 DIAGNOSIS — H01134 Eczematous dermatitis of left upper eyelid: Secondary | ICD-10-CM | POA: Diagnosis not present

## 2023-03-06 DIAGNOSIS — H01131 Eczematous dermatitis of right upper eyelid: Secondary | ICD-10-CM | POA: Diagnosis not present

## 2023-04-01 DIAGNOSIS — Z0189 Encounter for other specified special examinations: Secondary | ICD-10-CM | POA: Diagnosis not present

## 2023-04-01 DIAGNOSIS — E669 Obesity, unspecified: Secondary | ICD-10-CM | POA: Diagnosis not present

## 2023-06-03 DIAGNOSIS — E063 Autoimmune thyroiditis: Secondary | ICD-10-CM | POA: Diagnosis not present

## 2023-06-03 DIAGNOSIS — N926 Irregular menstruation, unspecified: Secondary | ICD-10-CM | POA: Diagnosis not present

## 2023-06-03 DIAGNOSIS — E669 Obesity, unspecified: Secondary | ICD-10-CM | POA: Diagnosis not present

## 2023-07-18 DIAGNOSIS — E669 Obesity, unspecified: Secondary | ICD-10-CM | POA: Diagnosis not present

## 2023-07-18 DIAGNOSIS — E782 Mixed hyperlipidemia: Secondary | ICD-10-CM | POA: Diagnosis not present

## 2023-07-18 DIAGNOSIS — Z7689 Persons encountering health services in other specified circumstances: Secondary | ICD-10-CM | POA: Diagnosis not present

## 2023-07-18 DIAGNOSIS — E063 Autoimmune thyroiditis: Secondary | ICD-10-CM | POA: Diagnosis not present

## 2023-08-01 DIAGNOSIS — E063 Autoimmune thyroiditis: Secondary | ICD-10-CM | POA: Diagnosis not present

## 2023-08-26 DIAGNOSIS — Z23 Encounter for immunization: Secondary | ICD-10-CM | POA: Diagnosis not present

## 2023-08-26 DIAGNOSIS — E669 Obesity, unspecified: Secondary | ICD-10-CM | POA: Diagnosis not present

## 2023-08-26 DIAGNOSIS — F329 Major depressive disorder, single episode, unspecified: Secondary | ICD-10-CM | POA: Diagnosis not present

## 2023-08-26 DIAGNOSIS — Z0000 Encounter for general adult medical examination without abnormal findings: Secondary | ICD-10-CM | POA: Diagnosis not present

## 2023-08-26 DIAGNOSIS — E063 Autoimmune thyroiditis: Secondary | ICD-10-CM | POA: Diagnosis not present

## 2023-08-26 DIAGNOSIS — F322 Major depressive disorder, single episode, severe without psychotic features: Secondary | ICD-10-CM | POA: Diagnosis not present

## 2023-08-28 DIAGNOSIS — R197 Diarrhea, unspecified: Secondary | ICD-10-CM | POA: Diagnosis not present

## 2023-09-02 DIAGNOSIS — K5120 Ulcerative (chronic) proctitis without complications: Secondary | ICD-10-CM | POA: Diagnosis not present

## 2023-09-03 DIAGNOSIS — Z79899 Other long term (current) drug therapy: Secondary | ICD-10-CM | POA: Diagnosis not present

## 2023-09-03 DIAGNOSIS — K5190 Ulcerative colitis, unspecified, without complications: Secondary | ICD-10-CM | POA: Diagnosis not present

## 2023-09-03 DIAGNOSIS — K5120 Ulcerative (chronic) proctitis without complications: Secondary | ICD-10-CM | POA: Diagnosis not present

## 2023-09-03 DIAGNOSIS — E063 Autoimmune thyroiditis: Secondary | ICD-10-CM | POA: Diagnosis not present

## 2023-09-03 DIAGNOSIS — K5289 Other specified noninfective gastroenteritis and colitis: Secondary | ICD-10-CM | POA: Diagnosis not present

## 2023-09-05 DIAGNOSIS — K5120 Ulcerative (chronic) proctitis without complications: Secondary | ICD-10-CM | POA: Diagnosis not present
